# Patient Record
Sex: Female | Born: 1985 | Race: White | Hispanic: No | Marital: Married | Smoking: Never smoker
Health system: Southern US, Community
[De-identification: ages and names within clinical notes are randomized; demographics above are authoritative.]

## PROBLEM LIST (undated history)

## (undated) DIAGNOSIS — C8190 Hodgkin lymphoma, unspecified, unspecified site: Secondary | ICD-10-CM

## (undated) DIAGNOSIS — G43009 Migraine without aura, not intractable, without status migrainosus: Secondary | ICD-10-CM

## (undated) DIAGNOSIS — B009 Herpesviral infection, unspecified: Secondary | ICD-10-CM

## (undated) DIAGNOSIS — J45909 Unspecified asthma, uncomplicated: Secondary | ICD-10-CM

## (undated) DIAGNOSIS — F418 Other specified anxiety disorders: Secondary | ICD-10-CM

## (undated) HISTORY — DX: Hodgkin lymphoma, unspecified, unspecified site: C81.90

## (undated) HISTORY — DX: Migraine without aura, not intractable, without status migrainosus: G43.009

## (undated) HISTORY — DX: Herpesviral infection, unspecified: B00.9

## (undated) HISTORY — DX: Unspecified asthma, uncomplicated: J45.909

## (undated) HISTORY — DX: Other specified anxiety disorders: F41.8

---
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
## 2007-04-09 IMAGING — CT CT ABDOMEN W/ CM
2 of 5 series · 17 of 46 positions shown, 19 images · IV contrast (READICAT/WATER & [ID] OMNI 300)
Comparison: none

CLINICAL DATA: 20-year-old, with Hodgkin?s disease.  
ABDOMEN CT WITH CONTRAST:
TECHNIQUE: Multidetector CT imaging of the abdomen was performed following the standard protocol during bolus administration of intravenous contrast.
Contrast:  100 cc Omnipaque 300
TECHNIQUE: Multidetector CT imaging of the pelvis was performed following the standard protocol during bolus administration of intravenous contrast.

[Series 2: abdomen w/ · axial · 0.70mm/px · z∈[-377,-7]mm · 14 of 86 slices shown, 16 images]
[im 6/86  soft-tissue]
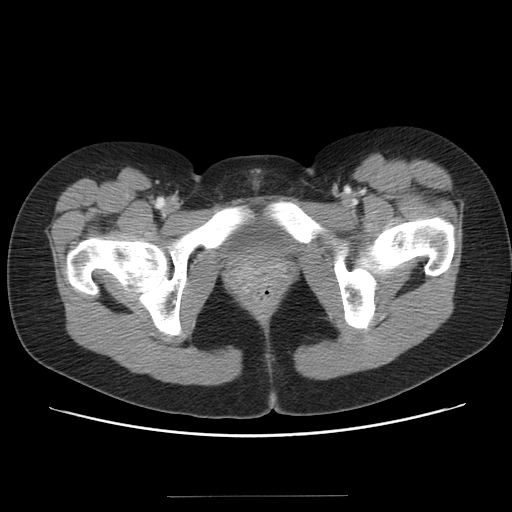
[im 6/86  bone]
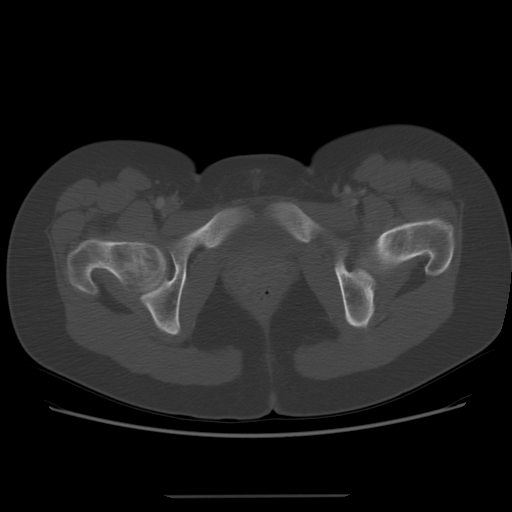
[im 12/86  soft-tissue]
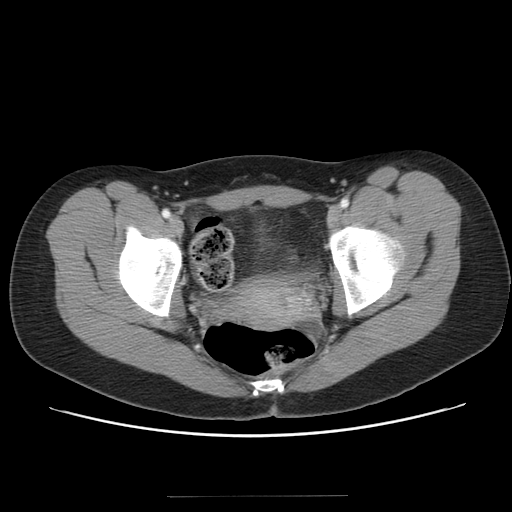
[im 18/86  soft-tissue]
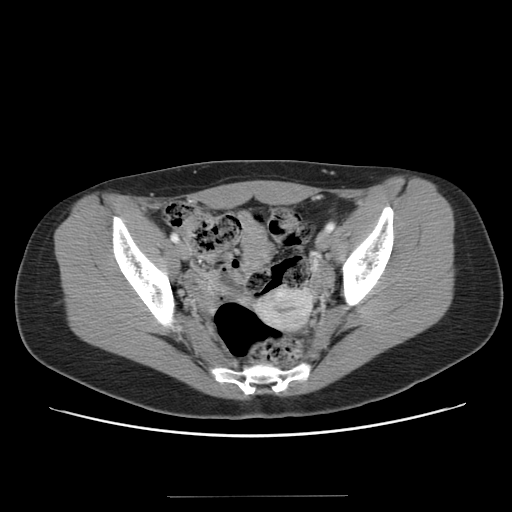
[im 23/86  soft-tissue]
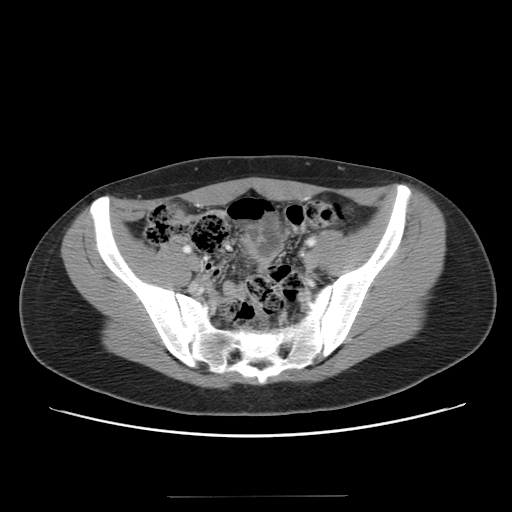
[im 29/86  soft-tissue]
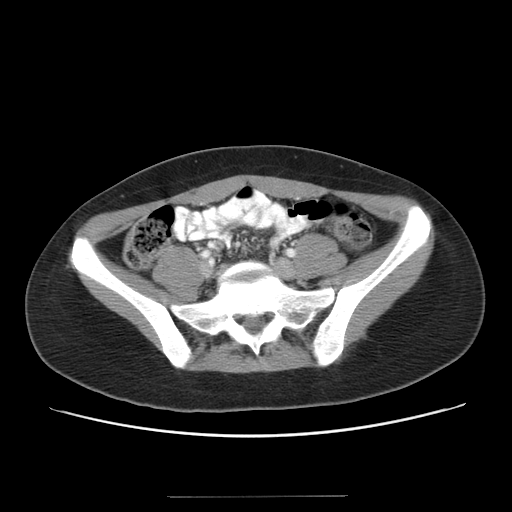
[im 35/86  soft-tissue]
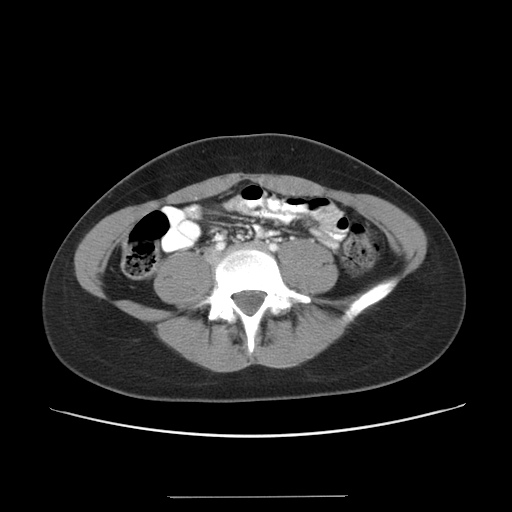
[im 40/86  soft-tissue]
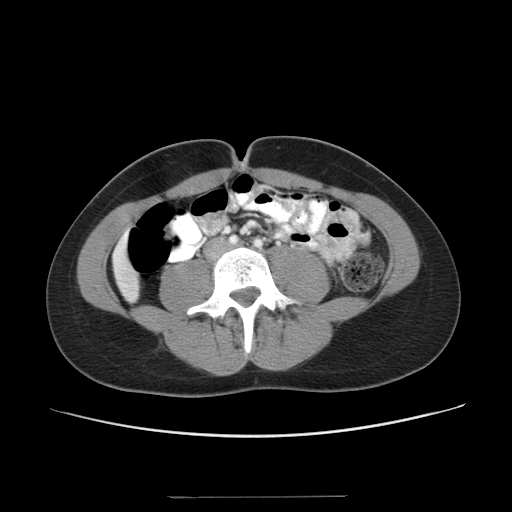
[im 46/86  soft-tissue]
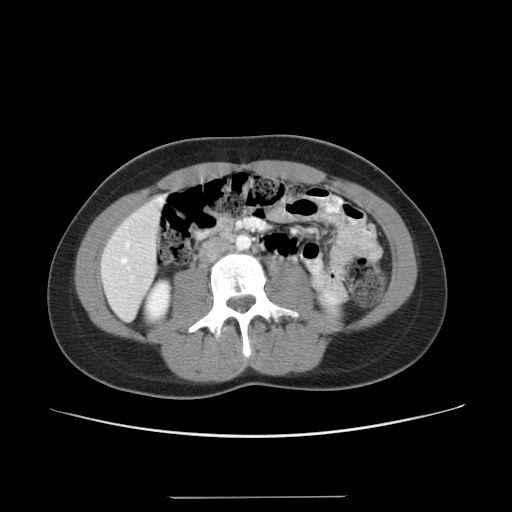
[im 52/86  soft-tissue]
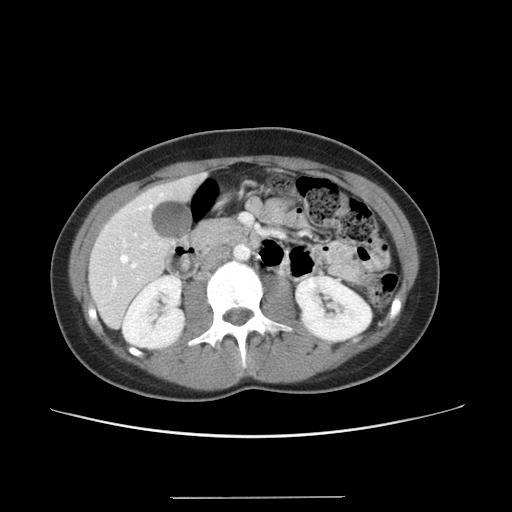
[im 52/86  bone]
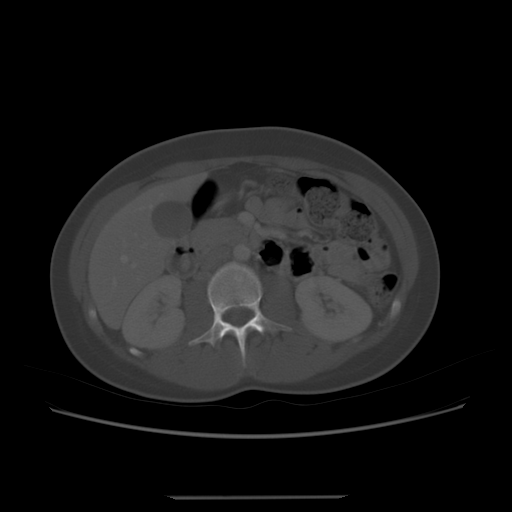
[im 57/86  soft-tissue]
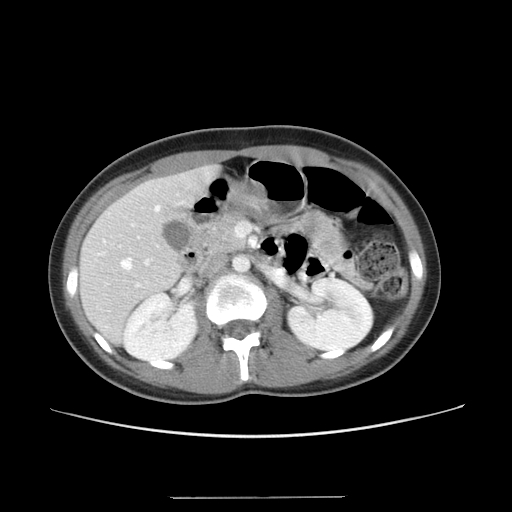
[im 63/86  soft-tissue]
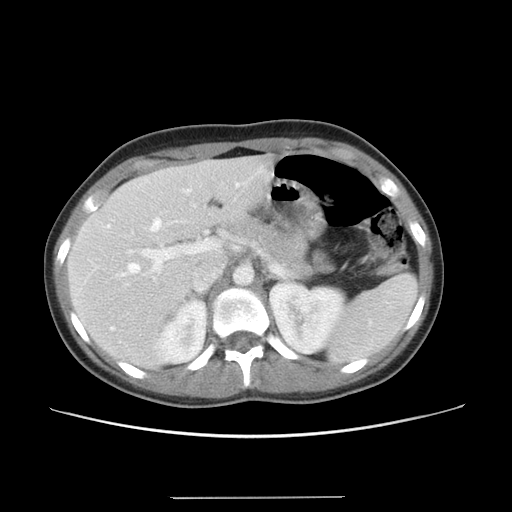
[im 69/86  soft-tissue]
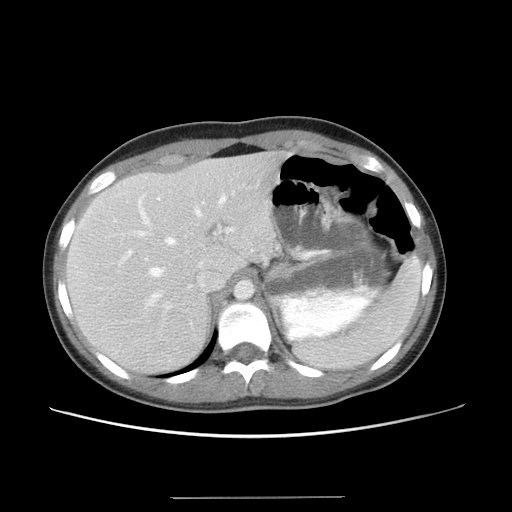
[im 74/86  soft-tissue]
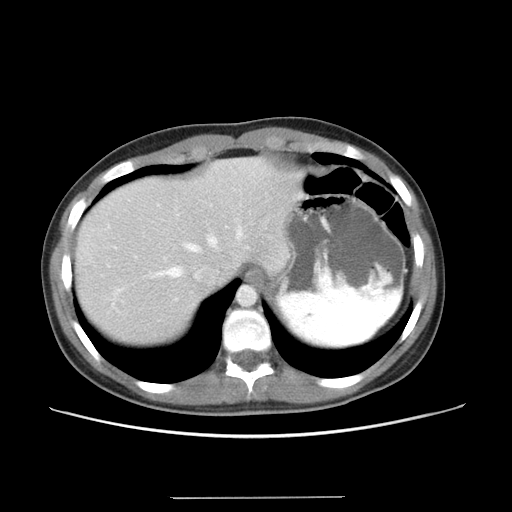
[im 80/86  soft-tissue]
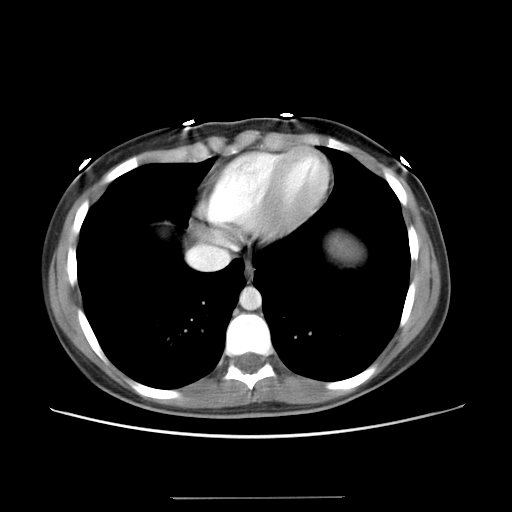

[Series 401: coronals · coronal · 0.90mm/px · 3 of 60 slices shown]
[im 20/60  soft-tissue]
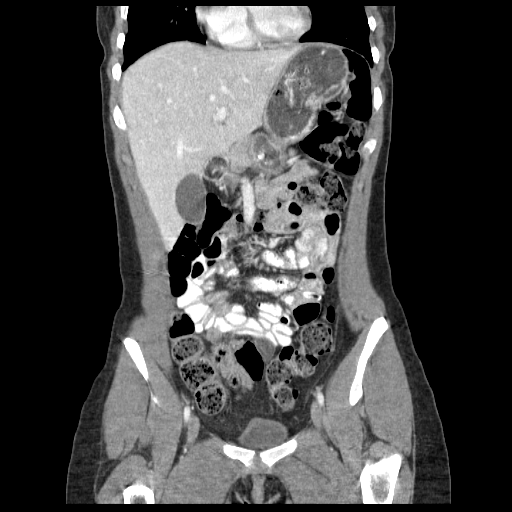
[im 27/60  soft-tissue]
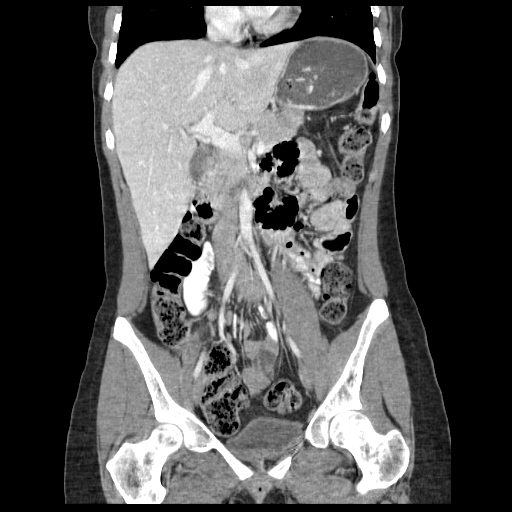
[im 33/60  soft-tissue]
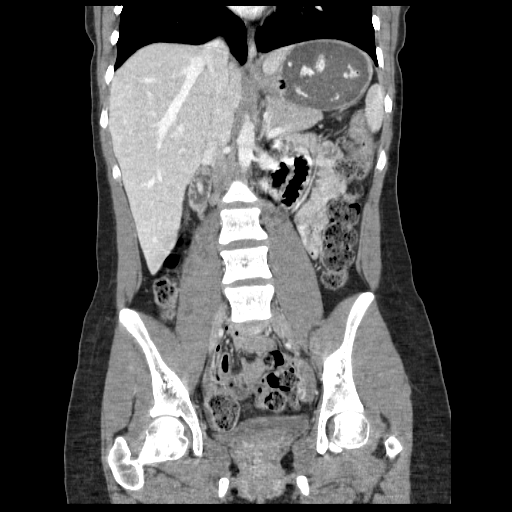

[17 of 46 positions shown; findings below may reference images not displayed]

FINDINGS: The lung bases are clear.  The liver, hepatic veins, portal veins, and gallbladder have a normal appearance.  The adrenal glands, kidneys, spleen, stomach, and pancreas are within normal limits.  There is no significant abdominal lymphadenopathy.  There is bowel in the left upper quadrant anterior to the stomach and spleen.  No acute bone abnormalities.
IMPRESSION: Negative CT of the abdomen. 
PELVIS CT WITH CONTRAST:
FINDINGS: The uterus and adnexal structures appear to be normal for age.  Small tubular structure in the right lower quadrant probably represents the appendix but it is hard to differentiate appendix from small bowel loops in this area.  No definite inflammatory changes.  Negative for lymphadenopathy in the pelvis.  Small amount of free fluid in the left cul-de-sac probably physiologic in a patient of this age.  No acute bone abnormalities.
IMPRESSION: 1.  No acute findings in the pelvis.  No significant lymphadenopathy.  
2.  Small amount of free fluid likely physiologic in nature.

## 2007-09-24 ENCOUNTER — Inpatient Hospital Stay (HOSPITAL_COMMUNITY): Admission: AD | Admit: 2007-09-24 | Discharge: 2007-09-24 | Payer: Self-pay | Admitting: Obstetrics & Gynecology

## 2008-05-16 ENCOUNTER — Other Ambulatory Visit: Admission: RE | Admit: 2008-05-16 | Discharge: 2008-05-16 | Payer: Self-pay | Admitting: Obstetrics and Gynecology

## 2008-05-24 ENCOUNTER — Emergency Department (HOSPITAL_COMMUNITY): Admission: EM | Admit: 2008-05-24 | Discharge: 2008-05-24 | Payer: Self-pay | Admitting: Emergency Medicine

## 2010-04-07 HISTORY — PX: BREAST SURGERY: SHX581

## 2010-07-23 LAB — RAPID URINE DRUG SCREEN, HOSP PERFORMED
Amphetamines: NOT DETECTED
Benzodiazepines: NOT DETECTED
Cocaine: NOT DETECTED
Tetrahydrocannabinol: NOT DETECTED

## 2010-07-23 LAB — PREGNANCY, URINE: Preg Test, Ur: NEGATIVE

## 2010-07-23 LAB — ETHANOL: Alcohol, Ethyl (B): 321 mg/dL — ABNORMAL HIGH (ref 0–10)

## 2010-08-20 NOTE — Op Note (Signed)
Susan Hartman, Susan Hartman           ACCOUNT NO.:  [id number]   MEDICAL RECORD NO.:  [id number]          PATIENT TYPE:  AMB   LOCATION:  [facility]                          FACILITY:  [facility]   PHYSICIAN:  Cherylynn Ridges, M.D.    DATE OF BIRTH:  Jul 30, 1985   DATE OF PROCEDURE:  DATE OF DISCHARGE:                               OPERATIVE REPORT   PREOPERATIVE DIAGNOSIS:  Functional Port-A-Cath and left subclavian  system.   POSTOPERATIVE DIAGNOSIS:  Functional Port-A-Cath and left subclavian  system.   PROCEDURE:  Removal of Port-A-Cath, this was done in the minor room with  [facility].   Dr. Lindie Spruce was the surgeon.   ANESTHESIA:  Local 10 mL of 1% Xylocaine without epinephrine.   ESTIMATED BLOOD LOSS:  Less than 10 mL.   COMPLICATIONS:  None.   CONDITION:  Stable.   OPERATION:  The patient was done in the minor room.  She was prepped  with Betadine.  We anesthetized the patient's previous scar from the  Port-A-Cath placement with 1% Xylocaine using a 30 gauge needle.  We  made an oval incision around the old widened scar.  Cut it down to  subcutaneous tissue.  We dissected off the scar down to the cath itself  and then dissected off the catheter from the subclavian vein and then  also from the pocket itself.  We excised the scar tissue using a 15  blade.  Once all of the pieces of the Port-A-Cath were removed, we  closed in 2 layers, deep subcutaneous layer 4-0 Vicryl, and then the  skin was closed using running subcuticular stitch of 4-0 Monocryl.  A  sterile dressing was applied including 1/4 inch Steri-Strips and  antibiotic ointment.   The patient was allowed to go home immediately after the procedure.  A  sterile dressing was applied.      Cherylynn Ridges, M.D.  Electronically Signed     JOW/MEDQ  D:  11/26/2006  T:  11/26/2006  Job:  [id number]

## 2010-08-20 NOTE — Consult Note (Signed)
Susan Hartman, Susan Hartman           ACCOUNT NO.:  [id number]   MEDICAL RECORD NO.:  [id number]          PATIENT TYPE:  MAT   LOCATION:  [facility]                          FACILITY:  [facility]   PHYSICIAN:  M. Leda Quail, MD  DATE OF BIRTH:  25-Dec-1985   DATE OF CONSULTATION:  DATE OF DISCHARGE:  09/24/2007                                 CONSULTATION   CHIEF COMPLAINT:  Flank pain.   HISTORY OF PRESENT ILLNESS:  The patient is a 25 year old G1, A1 female  who was seen yesterday at the Urology Office by Jetta Lout secondary  to right flank pain.  She had what looked like a urinary tract infection  and she was treated with a single dose of IV antibiotics.  Her past  medical history is significant for a termination in May and what she  reports is a normal menstrual period in June.  A urine pregnancy test  was performed which was positive and an hCG was performed which was also  positive.  Quantitative was 11.  They had the patient come back today.  She does state that she is feeling better and her flank pain is better  and she has had no fevers.  However, it is still persistent and there is  some right lower quadrant pain as well.  They were anxious about a  possibility of an ectopic pregnancy and called me.  The patient reports  currently that her pain is better.  She has had some flank pain off and  on and lasts 24 hours over the last 2 days, but even that is better.  No  fevers at home.  No current vaginal bleeding.  No discharge.  No nausea,  vomiting, diarrhea, or constipation.  She is not very good with the date  of her termination.  We are going to guess about 6 weeks ago and she  does state that she had her last menstrual period in June, but she is  not clearly sure about that as well.  The patient's past medical history  is significant for lymphoma.  She was treated with chemotherapy during  the last year.  She has also had a history of abnormal Pap smears.  She  also has a history  of PACs of the heart.   MEDICATIONS:  Xanax p.r.n. and NuvaRing which she did state she started  after her termination.   ALLERGIES:  She is not allergic to any antibiotics.   SOCIAL HISTORY:  She does smoke.  She denies drugs or alcohol use.   PHYSICAL EXAMINATION:  VITAL SIGNS:  Temp is 97.7, BP 132/77, pulse 76,  and respirations 20.  GENERAL:  Thin, well-nourished, well-developed female in no acute  distress.  CARDIOVASCULAR:  Regular rate and rhythm without murmurs, rubs, or  gallops.  LUNGS:  Clear to auscultation bilaterally.  ABDOMEN:  Soft, nontender, and nondistended.  Normal bowel sounds are  present.  No hepatosplenomegaly, hernias, masses, rebound, or guarding  is noted.  Some mild right flank tenderness.  Brief bimanual exam was  performed, which showed no cervical motion tenderness.  No adnexal  masses.  LABORATORY DATA:  White count 6.1, hemoglobin 14.3, and platelets 263.  Blood type O positive.  HGG here is 9 compared to 11 yesterday.  A GYN  ultrasound was performed which showed normal ovaries.  The right ovary  measures 4.2 x 1.2 x 1.4 cm and the left is 2.9 x 2.9 x 1.9 cm.  Thin  endometrial stripe with no evidence of gestational sac or IUP.  Normal  ovaries.  No adnexal masses, no pelvic mass is seen.   ASSESSMENT AND PLAN:  Urinary tract infection with possible  pyelonephritis. Ciprofloxacin 500 mg b.i.d. x10 days was given to the  patient.  She will follow up in my office next Friday at 11:30 for a hCG  quant.  Gonorrhea and  chlamydia are currently pending, and these were done at the Urology  Office.  She is also going to follow up with them in 2 weeks.  She was  advised to call if she has a fever over the weekend, particularly  greater than 100.5 or if her pain worsens.  Instructions were provided  both to the patient and her mother.      Lum Keas, MD  Electronically Signed     MSM/MEDQ  D:  09/24/2007  T:  09/25/2007  Job:  [id number]

## 2010-08-23 NOTE — Op Note (Signed)
NAMEMARITTA, Susan           ACCOUNT NO.:  [id number]   MEDICAL RECORD NO.:  [id number]          PATIENT TYPE:  AMB   LOCATION:  [facility]                          FACILITY:  [facility]   PHYSICIAN:  Cherylynn Ridges, M.D.    DATE OF BIRTH:  1986-03-28   DATE OF PROCEDURE:  02/18/2006  DATE OF DISCHARGE:                                 OPERATIVE REPORT   PREOPERATIVE DIAGNOSIS:  Right supraclavicular and axillary adenopathy.   POSTOPERATIVE DIAGNOSIS:  Right supraclavicular and axillary adenopathy.   PROCEDURE:  A right supraclavicular lymph node biopsy.   SURGEON:  Marta Lamas. Lindie Spruce, M.D.  No assistant.   ANESTHESIA:  General with a laryngeal airway.   ESTIMATED BLOOD LOSS:  Less than 10 mL.   COMPLICATIONS:  None.   CONDITION:  Stable.   SPECIMEN:  A 2.5 x 2 cm rounded, firm supraclavicular lymph node, which was  sent fresh for lymphoma workup.   INDICATIONS FOR OPERATION:  The patient is an 25 year old with spawning  supraclavicular adenopathy, who comes in now for biopsy.   FINDINGS:  The patient had multiple supraclavicular lymph nodes on the right  side but we removed just the largest one, which could be seen protruding  underneath the skin.   OPERATION:  The patient was taken to the operating room and placed on the  table in supine position.  After an adequate general laryngeal airway  anesthetic was administered, she was prepped and draped in the usual sterile  manner exposing the right supraclavicular area.   We marked the area of skin incision directly overlying the mass on the right  side.  We subsequently used a 15 blade to make an 1-1/2 to 2-inch incision  directly above that, dissecting down to the platysma muscle and then through  the platysma muscle down to the lymph node, which was carefully dissected  away from its surrounding structures using hemoclips to obtain adequate  hemostasis of the blood vessels that were feeding this large mass.  There  was a more  medial and inferior lymph node that was attached to the one that  was removed, which we did not attempt to excise as we felt as though we had  adequate specimen with a 2.5 x 2 cm lymph node that was sent fresh for  lymphoma workup.   We irrigated with saline and closed in two layers, the platysma layer of  interrupted of 4-0 Vicryl, then the skin was closed using a running  subcuticular stitch of 5-0 Monocryl.  Marcaine 0.25% with epinephrine was  injected underneath the skin prior to closure and then a sterile dressing  was applied, including Steri-Strips.      Cherylynn Ridges, M.D.  Electronically Signed     JOW/MEDQ  D:  02/18/2006  T:  02/18/2006  Job:  [id number]   cc:   Carma Leaven, DO

## 2011-01-02 LAB — HCG, QUANTITATIVE, PREGNANCY: hCG, Beta Chain, Quant, S: 9 — ABNORMAL HIGH

## 2011-01-02 LAB — CBC
MCHC: 33.8
MCV: 95.9
Platelets: 263
RDW: 11.8

## 2011-01-17 LAB — DIFFERENTIAL
Basophils Absolute: 0
Eosinophils Relative: 2
Lymphocytes Relative: 39
Lymphs Abs: 2
Neutro Abs: 2.7
Neutrophils Relative %: 54

## 2011-01-17 LAB — CBC
HCT: 39.6
Platelets: 237
RDW: 15.2 — ABNORMAL HIGH
WBC: 5.1

## 2011-01-17 LAB — PROTIME-INR
INR: 1
Prothrombin Time: 12.9

## 2012-08-03 ENCOUNTER — Telehealth: Payer: Self-pay | Admitting: Nurse Practitioner

## 2012-08-03 ENCOUNTER — Other Ambulatory Visit: Payer: Self-pay | Admitting: Nurse Practitioner

## 2012-08-03 DIAGNOSIS — R200 Anesthesia of skin: Secondary | ICD-10-CM

## 2012-08-03 MED ORDER — LIDOCAINE-PRILOCAINE 2.5-2.5 % EX CREA: TOPICAL_CREAM | CUTANEOUS | Status: DC | PRN

## 2012-08-03 MED ORDER — LIDOCAINE-PRILOCAINE 2.5-2.5 % EX CREA: 1.0000 "application " | TOPICAL_CREAM | CUTANEOUS | Status: DC | PRN

## 2012-08-03 NOTE — Telephone Encounter (Signed)
Patient reqeust a Rx to pharmacy in [place], [place] for Lidocaine Cream. States she is having tomorrow a tattoo removed and was told to have doctor prescribe this to avoid being so painful. Patient states she has no PCP. Tattoo is on her arm 3" long and 2" wide. Please advise. Will let patient know. sue

## 2012-08-03 NOTE — Telephone Encounter (Signed)
Pt was wondering if patty can give her a Rx for lidocaine creme.

## 2012-08-03 NOTE — Telephone Encounter (Signed)
Patient notified of medication sent to her pharmacy

## 2012-08-03 NOTE — Telephone Encounter (Signed)
Emla cream has been put in as new order to [facility].  Let her know

## 2013-03-10 ENCOUNTER — Encounter: Payer: Self-pay | Admitting: Nurse Practitioner

## 2013-03-10 ENCOUNTER — Ambulatory Visit: Payer: Self-pay | Admitting: Nurse Practitioner

## 2013-03-11 ENCOUNTER — Encounter: Payer: Self-pay | Admitting: Nurse Practitioner

## 2013-06-21 ENCOUNTER — Ambulatory Visit: Payer: Self-pay | Admitting: Nurse Practitioner

## 2013-06-21 ENCOUNTER — Encounter: Payer: Self-pay | Admitting: Nurse Practitioner

## 2013-06-21 ENCOUNTER — Ambulatory Visit (INDEPENDENT_AMBULATORY_CARE_PROVIDER_SITE_OTHER): Payer: BC Managed Care – PPO | Admitting: Nurse Practitioner

## 2013-06-21 VITALS — BP 112/70 | HR 72 | Ht 61.0 in | Wt 109.0 lb

## 2013-06-21 DIAGNOSIS — N390 Urinary tract infection, site not specified: Secondary | ICD-10-CM

## 2013-06-21 DIAGNOSIS — Z0000 Encounter for general adult medical examination without abnormal findings: Secondary | ICD-10-CM

## 2013-06-21 DIAGNOSIS — Z01419 Encounter for gynecological examination (general) (routine) without abnormal findings: Secondary | ICD-10-CM

## 2013-06-21 LAB — POCT URINALYSIS DIPSTICK
Bilirubin, UA: NEGATIVE
GLUCOSE UA: NEGATIVE
Ketones, UA: NEGATIVE
LEUKOCYTES UA: NEGATIVE
Nitrite, UA: POSITIVE
Protein, UA: NEGATIVE
UROBILINOGEN UA: NEGATIVE
pH, UA: 5

## 2013-06-21 LAB — HEMOGLOBIN, FINGERSTICK: Hemoglobin, fingerstick: 13.7 g/dL (ref 12.0–16.0)

## 2013-06-21 MED ORDER — VALACYCLOVIR HCL 1 G PO TABS: ORAL_TABLET | ORAL | Status: DC

## 2013-06-21 MED ORDER — CIPROFLOXACIN HCL 500 MG PO TABS: 500.0000 mg | ORAL_TABLET | Freq: Two times a day (BID) | ORAL | Status: DC

## 2013-06-21 MED ORDER — ETONOGESTREL-ETHINYL ESTRADIOL 0.12-0.015 MG/24HR VA RING: VAGINAL_RING | VAGINAL | Status: DC

## 2013-06-21 NOTE — Patient Instructions (Signed)
General topics  Next pap or exam is  due in 1 year Take a Women's multivitamin Take 1200 mg. of calcium daily - prefer dietary If any concerns in interim to call back  Breast Self-Awareness Practicing breast self-awareness may pick up problems early, prevent significant medical complications, and possibly save your life. By practicing breast self-awareness, you can become familiar with how your breasts look and feel and if your breasts are changing. This allows you to notice changes early. It can also offer you some reassurance that your breast health is good. One way to learn what is normal for your breasts and whether your breasts are changing is to do a breast self-exam. If you find a lump or something that was not present in the past, it is best to contact your caregiver right away. Other findings that should be evaluated by your caregiver include nipple discharge, especially if it is bloody; skin changes or reddening; areas where the skin seems to be pulled in (retracted); or new lumps and bumps. Breast pain is seldom associated with cancer (malignancy), but should also be evaluated by a caregiver. BREAST SELF-EXAM The best time to examine your breasts is 5 7 days after your menstrual period is over.  ExitCare Patient Information 2013 [employer].   Exercise to Stay Healthy Exercise helps you become and stay healthy. EXERCISE IDEAS AND TIPS Choose exercises that:  You enjoy.  Fit into your day. You do not need to exercise really hard to be healthy. You can do exercises at a slow or medium level and stay healthy. You can:  Stretch before and after working out.  Try yoga, Pilates, or tai chi.  Lift weights.  Walk fast, swim, jog, run, climb stairs, bicycle, dance, or rollerskate.  Take aerobic classes. Exercises that burn about 150 calories:  Running 1  miles in 15 minutes.  Playing volleyball for 45 to 60 minutes.  Washing and waxing a car for 45 to 60  minutes.  Playing touch football for 45 minutes.  Walking 1  miles in 35 minutes.  Pushing a stroller 1  miles in 30 minutes.  Playing basketball for 30 minutes.  Raking leaves for 30 minutes.  Bicycling 5 miles in 30 minutes.  Walking 2 miles in 30 minutes.  Dancing for 30 minutes.  Shoveling snow for 15 minutes.  Swimming laps for 20 minutes.  Walking up stairs for 15 minutes.  Bicycling 4 miles in 15 minutes.  Gardening for 30 to 45 minutes.  Jumping rope for 15 minutes.  Washing windows or floors for 45 to 60 minutes. Document Released: 04/26/2010 Document Revised: 06/16/2011 Document Reviewed: 04/26/2010 ExitCare Patient Information 2013 ExitCare[employer].   Other topics ( that may be useful information):    Sexually Transmitted Disease Sexually transmitted disease (STD) refers to any infection that is passed from person to person during sexual activity. This may happen by way of saliva, semen, blood, vaginal mucus, or urine. Common STDs include:  Gonorrhea.  Chlamydia.  Syphilis.  HIV/AIDS.  Genital herpes.  Hepatitis B and C.  Trichomonas.  Human papillomavirus (HPV).  Pubic lice. CAUSES  An STD may be spread by bacteria, virus, or parasite. A person can get an STD by:  Sexual intercourse with an infected person.  Sharing sex toys with an infected person.  Sharing needles with an infected person.  Having intimate contact with the genitals, mouth, or rectal areas of an infected person. SYMPTOMS  Some people may not have any symptoms, but   they can still pass the infection to others. Different STDs have different symptoms. Symptoms include:  Painful or bloody urination.  Pain in the pelvis, abdomen, vagina, anus, throat, or eyes.  Skin rash, itching, irritation, growths, or sores (lesions). These usually occur in the genital or anal area.  Abnormal vaginal discharge.  Penile discharge in men.  Soft, flesh-colored skin growths in the  genital or anal area.  Fever.  Pain or bleeding during sexual intercourse.  Swollen glands in the groin area.  Yellow skin and eyes (jaundice). This is seen with hepatitis. DIAGNOSIS  To make a diagnosis, your caregiver may:  Take a medical history.  Perform a physical exam.  Take a specimen (culture) to be examined.  Examine a sample of discharge under a microscope.  Perform blood test TREATMENT   Chlamydia, gonorrhea, trichomonas, and syphilis can be cured with antibiotic medicine.  Genital herpes, hepatitis, and HIV can be treated, but not cured, with prescribed medicines. The medicines will lessen the symptoms.  Genital warts from HPV can be treated with medicine or by freezing, burning (electrocautery), or surgery. Warts may come back.  HPV is a virus and cannot be cured with medicine or surgery.However, abnormal areas may be followed very closely by your caregiver and may be removed from the cervix, vagina, or vulva through office procedures or surgery. If your diagnosis is confirmed, your recent sexual partners need treatment. This is true even if they are symptom-free or have a negative culture or evaluation. They should not have sex until their caregiver says it is okay. HOME CARE INSTRUCTIONS  All sexual partners should be informed, tested, and treated for all STDs.  Take your antibiotics as directed. Finish them even if you start to feel better.  Only take over-the-counter or prescription medicines for pain, discomfort, or fever as directed by your caregiver.  Rest.  Eat a balanced diet and drink enough fluids to keep your urine clear or pale yellow.  Do not have sex until treatment is completed and you have followed up with your caregiver. STDs should be checked after treatment.  Keep all follow-up appointments, Pap tests, and blood tests as directed by your caregiver.  Only use latex condoms and water-soluble lubricants during sexual activity. Do not use  petroleum jelly or oils.  Avoid alcohol and illegal drugs.  Get vaccinated for HPV and hepatitis. If you have not received these vaccines in the past, talk to your caregiver about whether one or both might be right for you.  Avoid risky sex practices that can break the skin. The only way to avoid getting an STD is to avoid all sexual activity.Latex condoms and dental dams (for oral sex) will help lessen the risk of getting an STD, but will not completely eliminate the risk. SEEK MEDICAL CARE IF:   You have a fever.  You have any new or worsening symptoms. Document Released: 06/14/2002 Document Revised: 06/16/2011 Document Reviewed: 06/21/2010 [facility] Patient Information 2013 [place].    Domestic Abuse You are being battered or abused if someone close to you hits, pushes, or physically hurts you in any way. You also are being abused if you are forced into activities. You are being sexually abused if you are forced to have sexual contact of any kind. You are being emotionally abused if you are made to feel worthless or if you are constantly threatened. It is important to remember that help is available. No one has the right to abuse you. PREVENTION OF FURTHER  ABUSE  Learn the warning signs of danger. This varies with situations but may include: the use of alcohol, threats, isolation from friends and family, or forced sexual contact. Leave if you feel that violence is going to occur.  If you are attacked or beaten, report it to the police so the abuse is documented. You do not have to press charges. The police can protect you while you or the attackers are leaving. Get the officer's name and badge number and a copy of the report.  Find someone you can trust and tell them what is happening to you: your caregiver, a nurse, clergy member, close friend or family member. Feeling ashamed is natural, but remember that you have done nothing wrong. No one deserves abuse. Document Released:  03/21/2000 Document Revised: 06/16/2011 Document Reviewed: 05/30/2010 ExitCare Patient Information 2013 [employer].    How Much is Too Much Alcohol? Drinking too much alcohol can cause injury, accidents, and health problems. These types of problems can include:   Car crashes.  Falls.  Family fighting (domestic violence).  Drowning.  Fights.  Injuries.  Burns.  Damage to certain organs.  Having a baby with birth defects. ONE DRINK CAN BE TOO MUCH WHEN YOU ARE:  Working.  Pregnant or breastfeeding.  Taking medicines. Ask your doctor.  Driving or planning to drive. If you or someone you know has a drinking problem, get help from a doctor.  Document Released: 01/18/2009 Document Revised: 06/16/2011 Document Reviewed: 01/18/2009 ExitCare Patient Information 2013 [employer]Care[employer].   Smoking Hazards Smoking cigarettes is extremely bad for your health. Tobacco smoke has over 200 known poisons in it. There are over 60 chemicals in tobacco smoke that cause cancer. Some of the chemicals found in cigarette smoke include:   Cyanide.  Benzene.  Formaldehyde.  Methanol (wood alcohol).  Acetylene (fuel used in welding torches).  Ammonia. Cigarette smoke also contains the poisonous gases nitrogen oxide and carbon monoxide.  Cigarette smokers have an increased risk of many serious medical problems and Smoking causes approximately:  90% of all lung cancer deaths in men.  80% of all lung cancer deaths in women.  90% of deaths from chronic obstructive lung disease. Compared with nonsmokers, smoking increases the risk of:  Coronary heart disease by 2 to 4 times.  Stroke by 2 to 4 times.  Men developing lung cancer by 23 times.  Women developing lung cancer by 13 times.  Dying from chronic obstructive lung diseases by 12 times.  . Smoking is the most preventable cause of death and disease in our society.  WHY IS SMOKING ADDICTIVE?  Nicotine is the chemical  agent in tobacco that is capable of causing addiction or dependence.  When you smoke and inhale, nicotine is absorbed rapidly into the bloodstream through your lungs. Nicotine absorbed through the lungs is capable of creating a powerful addiction. Both inhaled and non-inhaled nicotine may be addictive.  Addiction studies of cigarettes and spit tobacco show that addiction to nicotine occurs mainly during the teen years, when young people begin using tobacco products. WHAT ARE THE BENEFITS OF QUITTING?  There are many health benefits to quitting smoking.   Likelihood of developing cancer and heart disease decreases. Health improvements are seen almost immediately.  Blood pressure, pulse rate, and breathing patterns start returning to normal soon after quitting. QUITTING SMOKING   American Lung Association - 1-800-LUNGUSA  American Cancer Society - 1-800-ACS-2345 Document Released: 05/01/2004 Document Revised: 06/16/2011 Document Reviewed: 01/03/2009 ExitCare Patient Information 2013 ExitCare,   LLC.   Stress Management Stress is a state of physical or mental tension that often results from changes in your life or normal routine. Some common causes of stress are:  Death of a loved one.  Injuries or severe illnesses.  Getting fired or changing jobs.  Moving into a new home. Other causes may be:  Sexual problems.  Business or financial losses.  Taking on a large debt.  Regular conflict with someone at home or at work.  Constant tiredness from lack of sleep. It is not just bad things that are stressful. It may be stressful to:  Win the lottery.  Get married.  Buy a new car. The amount of stress that can be easily tolerated varies from person to person. Changes generally cause stress, regardless of the types of change. Too much stress can affect your health. It may lead to physical or emotional problems. Too little stress (boredom) may also become stressful. SUGGESTIONS TO  REDUCE STRESS:  Talk things over with your family and friends. It often is helpful to share your concerns and worries. If you feel your problem is serious, you may want to get help from a professional counselor.  Consider your problems one at a time instead of lumping them all together. Trying to take care of everything at once may seem impossible. List all the things you need to do and then start with the most important one. Set a goal to accomplish 2 or 3 things each day. If you expect to do too many in a single day you will naturally fail, causing you to feel even more stressed.  Do not use alcohol or drugs to relieve stress. Although you may feel better for a short time, they do not remove the problems that caused the stress. They can also be habit forming.  Exercise regularly - at least 3 times per week. Physical exercise can help to relieve that "uptight" feeling and will relax you.  The shortest distance between despair and hope is often a good night's sleep.  Go to bed and get up on time allowing yourself time for appointments without being rushed.  Take a short "time-out" period from any stressful situation that occurs during the day. Close your eyes and take some deep breaths. Starting with the muscles in your face, tense them, hold it for a few seconds, then relax. Repeat this with the muscles in your neck, shoulders, hand, stomach, back and legs.  Take good care of yourself. Eat a balanced diet and get plenty of rest.  Schedule time for having fun. Take a break from your daily routine to relax. HOME CARE INSTRUCTIONS   Call if you feel overwhelmed by your problems and feel you can no longer manage them on your own.  Return immediately if you feel like hurting yourself or someone else. Document Released: 09/17/2000 Document Revised: 06/16/2011 Document Reviewed: 05/10/2007 ExitCare Patient Information 2013 ExitCare, LLC.  

## 2013-06-21 NOTE — Progress Notes (Signed)
Patient ID: Susan Hartman, female   DOB: 09-11-1985, 28 y.o.   MRN: [id number] 27 y.o. G1P0010 Single Caucasian Fe here for annual exam. Menses normally on Nuva Ring last for 4-5 days.  Moderate to light. Same partner for 3 years.  No concerns about STD's. She has had UTI symptoms for 2-3 days of urgency and slight dysuria.  Patient's last menstrual period was 06/08/2013.          Sexually active: yes  The current method of family planning is Nuva Ring vaginal inserts.    Exercising: yes  Gym/ health club routine includes cardio, light weights and one day of mod to heavy weights.  Pt goes to gym 4 times per week.. Smoker:  no  Health Maintenance: Pap: 03/02/12, WNL TDaP:  UTD Labs:  HB:  13.7  Urine:  Trace RBC, nitrate +   reports that she has never smoked. She has never used smokeless tobacco. She reports that she drinks about 0.5 ounces of alcohol per week. She reports that she does not use illicit drugs.  Past Medical History  Diagnosis Date  . Asthma   . Hodgkin lymphoma 11/07    Dr. Rockwell [place]  . HSV-2 infection     C&S    Past Surgical History  Procedure Laterality Date  . Breast surgery  04/2010    breast implants    Current Outpatient Prescriptions  Medication Sig Dispense Refill  . ALPRAZolam (XANAX PO) Take by mouth as needed.      . etonogestrel-ethinyl estradiol (NUVARING) 0.12-0.015 MG/24HR vaginal ring Insert vaginally and leave in place for 3 consecutive weeks, then remove for 1 week.  3 each  3  . ciprofloxacin (CIPRO) 500 MG tablet Take 1 tablet (500 mg total) by mouth 2 (two) times daily.  14 tablet  0  . valACYclovir (VALTREX) 1000 MG tablet 1/2 tablet daily and twice a day for flare X 5 days  90 tablet  3   No current facility-administered medications for this visit.    Family History  Problem Relation Age of Onset  . Hypertension Father   . Anxiety disorder Father     ROS:  Pertinent items are noted in HPI.  Otherwise, a comprehensive ROS was  negative.  Exam:   BP 112/70  Pulse 72  Ht 5\' 1"  (1.549 m)  Wt 109 lb (49.442 kg)  BMI 20.61 kg/m2  LMP 06/08/2013 Height: 5\' 1"  (154.9 cm)  Ht Readings from Last 3 Encounters:  06/21/13 5\' 1"  (1.549 m)    General appearance: alert, cooperative and appears stated age Head: Normocephalic, without obvious abnormality, atraumatic Neck: no adenopathy, supple, symmetrical, trachea midline and thyroid normal to inspection and palpation Lungs: clear to auscultation bilaterally Breasts: normal appearance, no masses or tenderness, positive findings: implants bilaterally Heart: regular rate and rhythm Abdomen: soft, non-tender; no masses,  no organomegaly Extremities: extremities normal, atraumatic, no cyanosis or edema Skin: Skin color, texture, turgor normal. No rashes or lesions Lymph nodes: Cervical, supraclavicular, and axillary nodes normal. No abnormal inguinal nodes palpated Neurologic: Grossly normal   Pelvic: External genitalia:  no lesions              Urethra:  normal appearing urethra with no masses, tenderness or lesions              Bartholin's and Skene's: normal                 Vagina: normal appearing vagina with normal color and discharge,  no lesions              Cervix: anteverted              Pap taken: yes Bimanual Exam:  Uterus:  normal size, contour, position, consistency, mobility, non-tender              Adnexa: no mass, fullness, tenderness               Rectovaginal: Confirms               Anus:  normal sphincter tone, no lesions  A:  Well Woman with normal exam  Contraception with Nuva Ring  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

## 2013-06-21 NOTE — Progress Notes (Signed)
Encounter reviewed by Dr. Timia Casselman Silva.  

## 2013-06-23 LAB — IPS PAP TEST WITH REFLEX TO HPV

## 2013-06-24 LAB — URINE CULTURE

## 2014-02-06 ENCOUNTER — Encounter: Payer: Self-pay | Admitting: Nurse Practitioner

## 2014-05-18 ENCOUNTER — Telehealth: Payer: Self-pay | Admitting: Nurse Practitioner

## 2014-05-18 NOTE — Telephone Encounter (Signed)
Patient wants to talk with the nurse about having some blood work done.

## 2014-05-19 NOTE — Telephone Encounter (Signed)
Message left to return call to Owin Vignola at [phone].    

## 2014-06-05 NOTE — Telephone Encounter (Signed)
Message left to return call to Camdon Saetern at [phone].    

## 2014-06-26 ENCOUNTER — Encounter: Payer: Self-pay | Admitting: Nurse Practitioner

## 2014-06-26 ENCOUNTER — Ambulatory Visit (INDEPENDENT_AMBULATORY_CARE_PROVIDER_SITE_OTHER): Payer: BLUE CROSS/BLUE SHIELD | Admitting: Nurse Practitioner

## 2014-06-26 VITALS — BP 90/64 | HR 68 | Ht 61.0 in | Wt 114.0 lb

## 2014-06-26 DIAGNOSIS — N926 Irregular menstruation, unspecified: Secondary | ICD-10-CM | POA: Diagnosis not present

## 2014-06-26 DIAGNOSIS — Z0000 Encounter for general adult medical examination without abnormal findings: Secondary | ICD-10-CM | POA: Diagnosis not present

## 2014-06-26 DIAGNOSIS — Z01419 Encounter for gynecological examination (general) (routine) without abnormal findings: Secondary | ICD-10-CM | POA: Diagnosis not present

## 2014-06-26 DIAGNOSIS — Z23 Encounter for immunization: Secondary | ICD-10-CM | POA: Diagnosis not present

## 2014-06-26 DIAGNOSIS — Z113 Encounter for screening for infections with a predominantly sexual mode of transmission: Secondary | ICD-10-CM | POA: Diagnosis not present

## 2014-06-26 LAB — POCT URINALYSIS DIPSTICK
Bilirubin, UA: NEGATIVE
GLUCOSE UA: NEGATIVE
Ketones, UA: NEGATIVE
LEUKOCYTES UA: NEGATIVE
NITRITE UA: NEGATIVE
PH UA: 6
Protein, UA: NEGATIVE
RBC UA: NEGATIVE
UROBILINOGEN UA: NEGATIVE

## 2014-06-26 LAB — HEMOGLOBIN, FINGERSTICK: HEMOGLOBIN, FINGERSTICK: 13.4 g/dL (ref 12.0–16.0)

## 2014-06-26 LAB — POCT URINE PREGNANCY: Preg Test, Ur: NEGATIVE

## 2014-06-26 MED ORDER — ETONOGESTREL-ETHINYL ESTRADIOL 0.12-0.015 MG/24HR VA RING: VAGINAL_RING | VAGINAL | Status: DC

## 2014-06-26 MED ORDER — VALACYCLOVIR HCL 1 G PO TABS: ORAL_TABLET | ORAL | Status: DC

## 2014-06-26 NOTE — Telephone Encounter (Signed)
Patient had office visit on 06/22/14. Will close encounter. Routing to provider for final review. Patient agreeable to disposition. Will close encounter

## 2014-06-26 NOTE — Patient Instructions (Signed)
General topics  Next pap or exam is  due in 1 year Take a Women's multivitamin Take 1200 mg. of calcium daily - prefer dietary If any concerns in interim to call back  Breast Self-Awareness Practicing breast self-awareness may pick up problems early, prevent significant medical complications, and possibly save your life. By practicing breast self-awareness, you can become familiar with how your breasts look and feel and if your breasts are changing. This allows you to notice changes early. It can also offer you some reassurance that your breast health is good. One way to learn what is normal for your breasts and whether your breasts are changing is to do a breast self-exam. If you find a lump or something that was not present in the past, it is best to contact your caregiver right away. Other findings that should be evaluated by your caregiver include nipple discharge, especially if it is bloody; skin changes or reddening; areas where the skin seems to be pulled in (retracted); or new lumps and bumps. Breast pain is seldom associated with cancer (malignancy), but should also be evaluated by a caregiver. BREAST SELF-EXAM The best time to examine your breasts is 5 7 days after your menstrual period is over.  ExitCare Patient Information 2013 [employer].   Exercise to Stay Healthy Exercise helps you become and stay healthy. EXERCISE IDEAS AND TIPS Choose exercises that:  You enjoy.  Fit into your day. You do not need to exercise really hard to be healthy. You can do exercises at a slow or medium level and stay healthy. You can:  Stretch before and after working out.  Try yoga, Pilates, or tai chi.  Lift weights.  Walk fast, swim, jog, run, climb stairs, bicycle, dance, or rollerskate.  Take aerobic classes. Exercises that burn about 150 calories:  Running 1  miles in 15 minutes.  Playing volleyball for 45 to 60 minutes.  Washing and waxing a car for 45 to 60  minutes.  Playing touch football for 45 minutes.  Walking 1  miles in 35 minutes.  Pushing a stroller 1  miles in 30 minutes.  Playing basketball for 30 minutes.  Raking leaves for 30 minutes.  Bicycling 5 miles in 30 minutes.  Walking 2 miles in 30 minutes.  Dancing for 30 minutes.  Shoveling snow for 15 minutes.  Swimming laps for 20 minutes.  Walking up stairs for 15 minutes.  Bicycling 4 miles in 15 minutes.  Gardening for 30 to 45 minutes.  Jumping rope for 15 minutes.  Washing windows or floors for 45 to 60 minutes. Document Released: 04/26/2010 Document Revised: 06/16/2011 Document Reviewed: 04/26/2010 ExitCare Patient Information 2013 ExitCare[employer].   Other topics ( that may be useful information):    Sexually Transmitted Disease Sexually transmitted disease (STD) refers to any infection that is passed from person to person during sexual activity. This may happen by way of saliva, semen, blood, vaginal mucus, or urine. Common STDs include:  Gonorrhea.  Chlamydia.  Syphilis.  HIV/AIDS.  Genital herpes.  Hepatitis B and C.  Trichomonas.  Human papillomavirus (HPV).  Pubic lice. CAUSES  An STD may be spread by bacteria, virus, or parasite. A person can get an STD by:  Sexual intercourse with an infected person.  Sharing sex toys with an infected person.  Sharing needles with an infected person.  Having intimate contact with the genitals, mouth, or rectal areas of an infected person. SYMPTOMS  Some people may not have any symptoms, but   they can still pass the infection to others. Different STDs have different symptoms. Symptoms include:  Painful or bloody urination.  Pain in the pelvis, abdomen, vagina, anus, throat, or eyes.  Skin rash, itching, irritation, growths, or sores (lesions). These usually occur in the genital or anal area.  Abnormal vaginal discharge.  Penile discharge in men.  Soft, flesh-colored skin growths in the  genital or anal area.  Fever.  Pain or bleeding during sexual intercourse.  Swollen glands in the groin area.  Yellow skin and eyes (jaundice). This is seen with hepatitis. DIAGNOSIS  To make a diagnosis, your caregiver may:  Take a medical history.  Perform a physical exam.  Take a specimen (culture) to be examined.  Examine a sample of discharge under a microscope.  Perform blood test TREATMENT   Chlamydia, gonorrhea, trichomonas, and syphilis can be cured with antibiotic medicine.  Genital herpes, hepatitis, and HIV can be treated, but not cured, with prescribed medicines. The medicines will lessen the symptoms.  Genital warts from HPV can be treated with medicine or by freezing, burning (electrocautery), or surgery. Warts may come back.  HPV is a virus and cannot be cured with medicine or surgery.However, abnormal areas may be followed very closely by your caregiver and may be removed from the cervix, vagina, or vulva through office procedures or surgery. If your diagnosis is confirmed, your recent sexual partners need treatment. This is true even if they are symptom-free or have a negative culture or evaluation. They should not have sex until their caregiver says it is okay. HOME CARE INSTRUCTIONS  All sexual partners should be informed, tested, and treated for all STDs.  Take your antibiotics as directed. Finish them even if you start to feel better.  Only take over-the-counter or prescription medicines for pain, discomfort, or fever as directed by your caregiver.  Rest.  Eat a balanced diet and drink enough fluids to keep your urine clear or pale yellow.  Do not have sex until treatment is completed and you have followed up with your caregiver. STDs should be checked after treatment.  Keep all follow-up appointments, Pap tests, and blood tests as directed by your caregiver.  Only use latex condoms and water-soluble lubricants during sexual activity. Do not use  petroleum jelly or oils.  Avoid alcohol and illegal drugs.  Get vaccinated for HPV and hepatitis. If you have not received these vaccines in the past, talk to your caregiver about whether one or both might be right for you.  Avoid risky sex practices that can break the skin. The only way to avoid getting an STD is to avoid all sexual activity.Latex condoms and dental dams (for oral sex) will help lessen the risk of getting an STD, but will not completely eliminate the risk. SEEK MEDICAL CARE IF:   You have a fever.  You have any new or worsening symptoms. Document Released: 06/14/2002 Document Revised: 06/16/2011 Document Reviewed: 06/21/2010 [facility] Patient Information 2013 [place].    Domestic Abuse You are being battered or abused if someone close to you hits, pushes, or physically hurts you in any way. You also are being abused if you are forced into activities. You are being sexually abused if you are forced to have sexual contact of any kind. You are being emotionally abused if you are made to feel worthless or if you are constantly threatened. It is important to remember that help is available. No one has the right to abuse you. PREVENTION OF FURTHER  ABUSE  Learn the warning signs of danger. This varies with situations but may include: the use of alcohol, threats, isolation from friends and family, or forced sexual contact. Leave if you feel that violence is going to occur.  If you are attacked or beaten, report it to the police so the abuse is documented. You do not have to press charges. The police can protect you while you or the attackers are leaving. Get the officer's name and badge number and a copy of the report.  Find someone you can trust and tell them what is happening to you: your caregiver, a nurse, clergy member, close friend or family member. Feeling ashamed is natural, but remember that you have done nothing wrong. No one deserves abuse. Document Released:  03/21/2000 Document Revised: 06/16/2011 Document Reviewed: 05/30/2010 ExitCare Patient Information 2013 [employer].    How Much is Too Much Alcohol? Drinking too much alcohol can cause injury, accidents, and health problems. These types of problems can include:   Car crashes.  Falls.  Family fighting (domestic violence).  Drowning.  Fights.  Injuries.  Burns.  Damage to certain organs.  Having a baby with birth defects. ONE DRINK CAN BE TOO MUCH WHEN YOU ARE:  Working.  Pregnant or breastfeeding.  Taking medicines. Ask your doctor.  Driving or planning to drive. If you or someone you know has a drinking problem, get help from a doctor.  Document Released: 01/18/2009 Document Revised: 06/16/2011 Document Reviewed: 01/18/2009 ExitCare Patient Information 2013 [employer]Care[employer].   Smoking Hazards Smoking cigarettes is extremely bad for your health. Tobacco smoke has over 200 known poisons in it. There are over 60 chemicals in tobacco smoke that cause cancer. Some of the chemicals found in cigarette smoke include:   Cyanide.  Benzene.  Formaldehyde.  Methanol (wood alcohol).  Acetylene (fuel used in welding torches).  Ammonia. Cigarette smoke also contains the poisonous gases nitrogen oxide and carbon monoxide.  Cigarette smokers have an increased risk of many serious medical problems and Smoking causes approximately:  90% of all lung cancer deaths in men.  80% of all lung cancer deaths in women.  90% of deaths from chronic obstructive lung disease. Compared with nonsmokers, smoking increases the risk of:  Coronary heart disease by 2 to 4 times.  Stroke by 2 to 4 times.  Men developing lung cancer by 23 times.  Women developing lung cancer by 13 times.  Dying from chronic obstructive lung diseases by 12 times.  . Smoking is the most preventable cause of death and disease in our society.  WHY IS SMOKING ADDICTIVE?  Nicotine is the chemical  agent in tobacco that is capable of causing addiction or dependence.  When you smoke and inhale, nicotine is absorbed rapidly into the bloodstream through your lungs. Nicotine absorbed through the lungs is capable of creating a powerful addiction. Both inhaled and non-inhaled nicotine may be addictive.  Addiction studies of cigarettes and spit tobacco show that addiction to nicotine occurs mainly during the teen years, when young people begin using tobacco products. WHAT ARE THE BENEFITS OF QUITTING?  There are many health benefits to quitting smoking.   Likelihood of developing cancer and heart disease decreases. Health improvements are seen almost immediately.  Blood pressure, pulse rate, and breathing patterns start returning to normal soon after quitting. QUITTING SMOKING   American Lung Association - 1-800-LUNGUSA  American Cancer Society - 1-800-ACS-2345 Document Released: 05/01/2004 Document Revised: 06/16/2011 Document Reviewed: 01/03/2009 ExitCare Patient Information 2013 ExitCare,   LLC.   Stress Management Stress is a state of physical or mental tension that often results from changes in your life or normal routine. Some common causes of stress are:  Death of a loved one.  Injuries or severe illnesses.  Getting fired or changing jobs.  Moving into a new home. Other causes may be:  Sexual problems.  Business or financial losses.  Taking on a large debt.  Regular conflict with someone at home or at work.  Constant tiredness from lack of sleep. It is not just bad things that are stressful. It may be stressful to:  Win the lottery.  Get married.  Buy a new car. The amount of stress that can be easily tolerated varies from person to person. Changes generally cause stress, regardless of the types of change. Too much stress can affect your health. It may lead to physical or emotional problems. Too little stress (boredom) may also become stressful. SUGGESTIONS TO  REDUCE STRESS:  Talk things over with your family and friends. It often is helpful to share your concerns and worries. If you feel your problem is serious, you may want to get help from a professional counselor.  Consider your problems one at a time instead of lumping them all together. Trying to take care of everything at once may seem impossible. List all the things you need to do and then start with the most important one. Set a goal to accomplish 2 or 3 things each day. If you expect to do too many in a single day you will naturally fail, causing you to feel even more stressed.  Do not use alcohol or drugs to relieve stress. Although you may feel better for a short time, they do not remove the problems that caused the stress. They can also be habit forming.  Exercise regularly - at least 3 times per week. Physical exercise can help to relieve that "uptight" feeling and will relax you.  The shortest distance between despair and hope is often a good night's sleep.  Go to bed and get up on time allowing yourself time for appointments without being rushed.  Take a short "time-out" period from any stressful situation that occurs during the day. Close your eyes and take some deep breaths. Starting with the muscles in your face, tense them, hold it for a few seconds, then relax. Repeat this with the muscles in your neck, shoulders, hand, stomach, back and legs.  Take good care of yourself. Eat a balanced diet and get plenty of rest.  Schedule time for having fun. Take a break from your daily routine to relax. HOME CARE INSTRUCTIONS   Call if you feel overwhelmed by your problems and feel you can no longer manage them on your own.  Return immediately if you feel like hurting yourself or someone else. Document Released: 09/17/2000 Document Revised: 06/16/2011 Document Reviewed: 05/10/2007 ExitCare Patient Information 2013 ExitCare, LLC.  

## 2014-06-26 NOTE — Progress Notes (Signed)
Patient ID: Susan Hartman, female   DOB: Jul 15, 1985, 29 y.o.   MRN: [id number] 28 y.o. G1P0010 Single  Caucasian Fe here for annual exam.  Same partner for 4 years.  Menses for 4 days. Ran out of [employer] and now this cycle is still spotting - which is normal for her not on birth control.  She continues to live in [place] and works evenings as a Chief Operating Officer.  Patient's last menstrual period was 06/05/2014.          Sexually active: Yes.    The current method of family planning is Teacher, early years/pre vaginal inserts.    Exercising: Yes.    cardio and weight lifting Smoker:  no  Health Maintenance: Pap:  06/21/13, negative  TDaP:  UTD Labs: HB:  13.4  Urine:  Negative  UPT = negative   reports that she has never smoked. She has never used smokeless tobacco. She reports that she drinks about 0.5 oz of alcohol per week. She reports that she does not use illicit drugs.  Past Medical History  Diagnosis Date  . Asthma   . Hodgkin lymphoma 11/07    Dr. Rockwell [place]  . HSV-2 infection     C&S    Past Surgical History  Procedure Laterality Date  . Breast surgery  04/2010    breast implants    Current Outpatient Prescriptions  Medication Sig Dispense Refill  . ALPRAZolam (XANAX PO) Take by mouth as needed.    . etonogestrel-ethinyl estradiol (NUVARING) 0.12-0.015 MG/24HR vaginal ring Insert vaginally and leave in place for 3 consecutive weeks, then remove for 1 week. 3 each 3  . valACYclovir (VALTREX) 1000 MG tablet 1/2 tablet daily and twice a day for flare X 5 days 90 tablet 3   No current facility-administered medications for this visit.    Family History  Problem Relation Age of Onset  . Hypertension Father   . Anxiety disorder Father     ROS:  Pertinent items are noted in HPI.  Otherwise, a comprehensive ROS was negative.  Exam:   BP 90/64 mmHg  Pulse 68  Ht 5\' 1"  (1.549 m)  Wt 114 lb (51.71 kg)  BMI 21.55 kg/m2  LMP 06/05/2014 Height: 5\' 1"  (154.9 cm) Ht Readings from Last 3  Encounters:  06/26/14 5\' 1"  (1.549 m)  06/21/13 5\' 1"  (1.549 m)    General appearance: alert, cooperative and appears stated age Head: Normocephalic, without obvious abnormality, atraumatic Neck: no adenopathy, supple, symmetrical, trachea midline and thyroid normal to inspection and palpation Lungs: clear to auscultation bilaterally Breasts: normal appearance, no masses or tenderness, positive findings: implants bilaterally Heart: regular rate and rhythm Abdomen: soft, non-tender; no masses,  no organomegaly Extremities: extremities normal, atraumatic, no cyanosis or edema Skin: Skin color, texture, turgor normal. No rashes or lesions, multiple tattoos Lymph nodes: Cervical, supraclavicular, and axillary nodes normal. No abnormal inguinal nodes palpated Neurologic: Grossly normal   Pelvic: External genitalia:  no lesions              Urethra:  normal appearing urethra with no masses, tenderness or lesions              Bartholin's and Skene's: normal                 Vagina: normal appearing vagina with normal color and discharge, no lesions              Cervix: anteverted  Pap taken: No. 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

## 2014-06-27 LAB — STD PANEL
HIV 1&2 Ab, 4th Generation: NONREACTIVE
Hepatitis B Surface Ag: NEGATIVE

## 2014-06-28 LAB — IPS N GONORRHOEA AND CHLAMYDIA BY PCR

## 2014-06-29 NOTE — Progress Notes (Signed)
Encounter reviewed by Dr. Brook Silva.  

## 2014-09-07 ENCOUNTER — Telehealth: Payer: Self-pay | Admitting: Nurse Practitioner

## 2014-09-07 NOTE — Telephone Encounter (Signed)
Patient calling requesting to speak with the nurse about "not having a period while on Nuvaring."

## 2014-09-07 NOTE — Telephone Encounter (Signed)
Spoke with patient. Patient states that she forgot to take her Nuvaring out as scheduled for this month. Ring was in for over four weeks per patient. Took Nuvaring out over 1 week ago and has not yet started her cycle. Patient denies replacing or removing ring prior to this one late. Patient unable to state when her LMP was. Advised will need to take UPT. Patient states she took UPT today which was negative. Advised may wait until start of next cycle to insert new ring or if UPT was negative can replace ring at this time but UPT must be negative to reinsert. Patient is agreeable and verbalizes understanding.  Milford Cage, FNP anything further for this patient?

## 2014-09-08 NOTE — Telephone Encounter (Signed)
If she has no period by 3 months since last used needs OV, needs consistent condom use until period

## 2014-09-08 NOTE — Telephone Encounter (Signed)
Spoke with patient. Advised of message as seen below from [place]. Patient is agreeable and verbalizes understanding.  Routing to provider for final review. Patient agreeable to disposition. Will close encounter.

## 2014-09-08 NOTE — Telephone Encounter (Signed)
Left message to call Aadon Gorelik at [phone]. 

## 2014-09-25 ENCOUNTER — Telehealth: Payer: Self-pay | Admitting: Nurse Practitioner

## 2014-09-25 NOTE — Telephone Encounter (Signed)
Attempted to reach patient at number provided [phone]. Recording states "Im sorry the person you are trying to reach has a voicemail box that has not been set up. Please try again later. Goodbye."

## 2014-09-25 NOTE — Telephone Encounter (Signed)
Patient say "I  was told to call if she did start my period" Patient is asking for an appointment Wed. 09/27/14. Last seen 06/26/14.

## 2014-09-25 NOTE — Telephone Encounter (Signed)
Spoke with patient. Patient states that she has not had a cycle "I have not had one in so long I can not even remember when my last one was." Patient has taken 3 UPT which have all been negative. Requesting appointment for 6/22 due to work schedule. Appointment scheduled for 6/22 at 12:45pm. Patient is agreeable to date and time.   Routing to provider for final review. Patient agreeable to disposition. Will close encounter.

## 2014-09-27 ENCOUNTER — Encounter: Payer: Self-pay | Admitting: Nurse Practitioner

## 2014-09-27 ENCOUNTER — Ambulatory Visit (INDEPENDENT_AMBULATORY_CARE_PROVIDER_SITE_OTHER): Payer: BLUE CROSS/BLUE SHIELD | Admitting: Nurse Practitioner

## 2014-09-27 VITALS — BP 104/72 | HR 72 | Ht 61.0 in | Wt 112.0 lb

## 2014-09-27 DIAGNOSIS — N912 Amenorrhea, unspecified: Secondary | ICD-10-CM | POA: Diagnosis not present

## 2014-09-27 LAB — TSH: TSH: 0.903 u[IU]/mL (ref 0.350–4.500)

## 2014-09-27 LAB — POCT URINE PREGNANCY: PREG TEST UR: NEGATIVE

## 2014-09-27 MED ORDER — MEDROXYPROGESTERONE ACETATE 10 MG PO TABS: 10.0000 mg | ORAL_TABLET | Freq: Every day | ORAL | Status: DC

## 2014-09-27 NOTE — Progress Notes (Signed)
  29 y.o.Single white female presents with no menses since either February or March.  She was here for AEX and had a Nuva Ring left in an extra week since she had ran out.  She did have a menses she thinks after removal of ring in March even though it was very light.  Then on April 10 after removal of ring she had no menses.  She did not insert another ring since no menses.  In the interim no signs of menses except mild cramp in the lower back last month. She has not been using any method of birth control in the interim.  She has taken several UPT which  Has always been negative.  Some bloating and PMS but only occasionally.     No changes in diet, exercise, still working, same partner for 2.5 years. No head injury.  No chest pain, abdominal pain, usual joint pain in both hips without taking new medication. ( We have documentation of a menses on 06/05/2014 and now we think that might be the last one.)   Urine pregnancy negative.  Patient denies having been diagnosed with metabolic syndrome,denies polycystic ovaries.   A comprehensive review of systems was negative. No exam performed today.  Assessment:  Amenorrhea - post contraception use for many years Plan: Discussed with patient factors that may be contributory to menstrual abnormalities include diet anorexia and bulimia, hormonal factors with thyroid or prolactin and recent stressors pregnancy.  Previous treatments for menstrual abnormalities include none Labs: TSH and Prolactin, serum HCG  Will then give Provera challenge 10 mg for 10 days and to call back with -/+ response.

## 2014-09-27 NOTE — Patient Instructions (Signed)
We will call you with test results You will be taking Provera 10 mg for 10 days - then call back with response if any bleeding Do not take Provera until we call you with a pregnancy results

## 2014-09-28 LAB — PROLACTIN: Prolactin: 17 ng/mL

## 2014-09-28 LAB — HCG, SERUM, QUALITATIVE: Preg, Serum: NEGATIVE

## 2014-09-30 NOTE — Progress Notes (Signed)
Encounter reviewed by Dr. Brook Silva.  

## 2015-07-10 ENCOUNTER — Ambulatory Visit: Payer: BLUE CROSS/BLUE SHIELD | Admitting: Nurse Practitioner

## 2015-08-03 ENCOUNTER — Ambulatory Visit: Payer: BLUE CROSS/BLUE SHIELD | Admitting: Nurse Practitioner

## 2015-08-07 ENCOUNTER — Encounter: Payer: Self-pay | Admitting: Nurse Practitioner

## 2015-08-07 ENCOUNTER — Ambulatory Visit (INDEPENDENT_AMBULATORY_CARE_PROVIDER_SITE_OTHER): Payer: BLUE CROSS/BLUE SHIELD | Admitting: Nurse Practitioner

## 2015-08-07 VITALS — BP 100/66 | HR 64 | Ht 61.25 in | Wt 110.0 lb

## 2015-08-07 DIAGNOSIS — Z0000 Encounter for general adult medical examination without abnormal findings: Secondary | ICD-10-CM

## 2015-08-07 DIAGNOSIS — Z01419 Encounter for gynecological examination (general) (routine) without abnormal findings: Secondary | ICD-10-CM | POA: Diagnosis not present

## 2015-08-07 LAB — CBC WITH DIFFERENTIAL/PLATELET
BASOS PCT: 0 %
Basophils Absolute: 0 cells/uL (ref 0–200)
EOS PCT: 1 %
Eosinophils Absolute: 62 cells/uL (ref 15–500)
HEMATOCRIT: 40.5 % (ref 35.0–45.0)
HEMOGLOBIN: 13.5 g/dL (ref 11.7–15.5)
LYMPHS ABS: 1922 {cells}/uL (ref 850–3900)
Lymphocytes Relative: 31 %
MCH: 31.2 pg (ref 27.0–33.0)
MCHC: 33.3 g/dL (ref 32.0–36.0)
MCV: 93.5 fL (ref 80.0–100.0)
MONO ABS: 248 {cells}/uL (ref 200–950)
MPV: 9.4 fL (ref 7.5–12.5)
Monocytes Relative: 4 %
NEUTROS ABS: 3968 {cells}/uL (ref 1500–7800)
Neutrophils Relative %: 64 %
Platelets: 260 10*3/uL (ref 140–400)
RBC: 4.33 MIL/uL (ref 3.80–5.10)
RDW: 12.8 % (ref 11.0–15.0)
WBC: 6.2 10*3/uL (ref 3.8–10.8)

## 2015-08-07 MED ORDER — ETONOGESTREL-ETHINYL ESTRADIOL 0.12-0.015 MG/24HR VA RING: VAGINAL_RING | VAGINAL | Status: DC

## 2015-08-07 MED ORDER — VALACYCLOVIR HCL 1 G PO TABS: ORAL_TABLET | ORAL | Status: DC

## 2015-08-07 NOTE — Progress Notes (Signed)
Patient ID: Susan Hartman, female   DOB: 29-Aug-1985, 30 y.o.   MRN: [id number]  30 y.o. G1P0010 Single  Caucasian Fe here for annual exam.  Menses now at 4 days, some cramps for 2 days relief with OTC NSAID's.  Same partner for 5 years.  Now back in therapy for anxiety. She is now living alone after leaving her boyfriend for 2 months and he does not trust her.  She needs to establish care with a psychiatrist in her area for medication therapy.  Patient's last menstrual period was 07/17/2015 (exact date).          Sexually active: Yes.    The current method of family planning is condoms never and Nuva Ring vaginal inserts. Same partner x 5 years. Exercising: Yes.  light weights and yoga 3-4 times per week at gym Smoker:  no  Health Maintenance: Pap: 06/21/13, Negative  TDaP: 06/26/14 HIV: 06/26/14 Labs: HB: 13.3 Urine: Negative    reports that she has never smoked. She has never used smokeless tobacco. She reports that she drinks about 0.5 oz of alcohol per week. She reports that she does not use illicit drugs.  Past Medical History  Diagnosis Date  . Asthma   . Hodgkin lymphoma ([place]) 11/07    Dr. Rockwell [place]  . HSV-2 infection     C&S    Past Surgical History  Procedure Laterality Date  . Breast surgery  04/2010    breast implants    Current Outpatient Prescriptions  Medication Sig Dispense Refill  . ALPRAZolam (XANAX PO) Take by mouth as needed.    . etonogestrel-ethinyl estradiol (NUVARING) 0.12-0.015 MG/24HR vaginal ring Insert vaginally and leave in place for 3 consecutive weeks, then remove for 1 week. 3 each 3  . valACYclovir (VALTREX) 1000 MG tablet 1/2 tablet daily and twice a day for flare X 5 days 90 tablet 3   No current facility-administered medications for this visit.    Family History  Problem Relation Age of Onset  . Hypertension Father   . Anxiety disorder Father     ROS:  Pertinent items are noted in HPI.  Otherwise, a comprehensive ROS was  negative.  Exam:   BP 100/66 mmHg  Pulse 64  Ht 5' 1.25" (1.556 m)  Wt 110 lb (49.896 kg)  BMI 20.61 kg/m2  LMP 07/17/2015 (Exact Date) Height: 5' 1.25" (155.6 cm) Ht Readings from Last 3 Encounters:  08/07/15 5' 1.25" (1.556 m)  09/27/14 5\' 1"  (1.549 m)  06/26/14 5\' 1"  (1.549 m)    General appearance: alert, cooperative and appears stated age Head: Normocephalic, without obvious abnormality, atraumatic Neck: no adenopathy, supple, symmetrical, trachea midline and thyroid normal to inspection and palpation Lungs: clear to auscultation bilaterally Breasts: normal appearance, no masses or tenderness, positive findings: implants bilaterally Heart: regular rate and rhythm Abdomen: soft, non-tender; no masses,  no organomegaly Extremities: extremities normal, atraumatic, no cyanosis or edema Skin: Skin color, texture, turgor normal. No rashes or lesions Lymph nodes: Cervical, supraclavicular, and axillary nodes normal. No abnormal inguinal nodes palpated Neurologic: Grossly normal   Pelvic: External genitalia:  no lesions              Urethra:  normal appearing urethra with no masses, tenderness or lesions              Bartholin's and Skene's: normal                 Vagina: normal appearing vagina with normal color  and discharge, no lesions              Cervix: anteverted              Pap taken: Yes.   Bimanual Exam:  Uterus:  normal size, contour, position, consistency, mobility, non-tender              Adnexa: no mass, fullness, tenderness               Rectovaginal: Confirms               Anus:  normal sphincter tone, no lesions  Chaperone present: yes  A:  Well Woman with normal exam  Contraception with Nuva Ring History of HSV II culture proven 12/2009 Multiple tattoos History of Hodgkin Lymphoma 02/2006 - Oncologist has left the area S/P breast implants    P:   Reviewed health and wellness pertinent to  exam  Pap smear as above  Refill on Nuva Ring for a year  Follow with labs  Counseled on breast self exam, use and side effects of OCP's, adequate intake of calcium and vitamin D, diet and exercise return annually or prn  An After Visit Summary was printed and given to the patient.

## 2015-08-07 NOTE — Patient Instructions (Signed)

## 2015-08-09 LAB — IPS PAP TEST WITH HPV

## 2015-08-12 NOTE — Progress Notes (Signed)
Encounter reviewed by Dr. Brook Amundson C. Silva.  

## 2015-08-14 LAB — HEMOGLOBIN, FINGERSTICK: Hemoglobin, fingerstick: 13.3 g/dL (ref 12.0–16.0)

## 2016-08-11 ENCOUNTER — Ambulatory Visit: Payer: BLUE CROSS/BLUE SHIELD | Admitting: Nurse Practitioner

## 2016-09-22 ENCOUNTER — Ambulatory Visit: Payer: BLUE CROSS/BLUE SHIELD | Admitting: Nurse Practitioner

## 2016-10-06 ENCOUNTER — Telehealth: Payer: Self-pay | Admitting: Nurse Practitioner

## 2016-10-06 NOTE — Telephone Encounter (Signed)
Left message regarding upcoming appointment has been canceled and needs to be rescheduled. °

## 2016-10-14 ENCOUNTER — Ambulatory Visit: Payer: BLUE CROSS/BLUE SHIELD | Admitting: Nurse Practitioner

## 2016-12-22 ENCOUNTER — Telehealth: Payer: Self-pay | Admitting: Obstetrics and Gynecology

## 2016-12-22 NOTE — Telephone Encounter (Signed)
Spoke with patient, patient is tearful. Reports history of anxiety and depression, has increased over the last year. Has experienced job change, "nothing that significant to cause increased anxiety". Denies suicidal or homicidal ideations. Moved to [place] 5 years ago. Patient previously scheduled for AEX with Dr. Talbert Nan on 12/29/16, declined earlier appointment.   Patient states she has taken Xanax in the past, unsure if this will be an option, or counselor, would like to discuss options? Advised patient to keep AEX as scheduled for further evaluation. Advised patient should symptoms worsen or thoughts of suicide develop, seek care at local ER after hours, call office if earlier appointment needed. Advised patient Dr. Talbert Nan will review, will return call with any additional recommendations. Patient verbalizes understanding and is agreeable.   Last AEX 08/07/15 -Kem Boroughs, NP  Dr. Talbert Nan, any additional recommendations?

## 2016-12-22 NOTE — Telephone Encounter (Signed)
Patient called requesting to speak with the nurse about "concerns about increasing anxiety."

## 2016-12-23 NOTE — Telephone Encounter (Signed)
When she is here we can discuss starting an SSRI, first line treatment for anxiety and depression. I don't prescribe Xanax because of the risk of dependence

## 2016-12-23 NOTE — Telephone Encounter (Signed)
Spoke with patient, advised as seen below per Dr. Talbert Nan. Patient verbalizes understanding and is agreeable.  Patient is agreeable to disposition. Will close encounter.

## 2016-12-24 DIAGNOSIS — F411 Generalized anxiety disorder: Secondary | ICD-10-CM | POA: Insufficient documentation

## 2016-12-29 ENCOUNTER — Ambulatory Visit (INDEPENDENT_AMBULATORY_CARE_PROVIDER_SITE_OTHER): Payer: BLUE CROSS/BLUE SHIELD | Admitting: Obstetrics and Gynecology

## 2016-12-29 ENCOUNTER — Encounter: Payer: Self-pay | Admitting: Obstetrics and Gynecology

## 2016-12-29 VITALS — BP 118/70 | HR 76 | Resp 16 | Ht 60.5 in | Wt 113.8 lb

## 2016-12-29 DIAGNOSIS — Z0000 Encounter for general adult medical examination without abnormal findings: Secondary | ICD-10-CM | POA: Diagnosis not present

## 2016-12-29 DIAGNOSIS — G43009 Migraine without aura, not intractable, without status migrainosus: Secondary | ICD-10-CM | POA: Diagnosis not present

## 2016-12-29 DIAGNOSIS — F418 Other specified anxiety disorders: Secondary | ICD-10-CM

## 2016-12-29 DIAGNOSIS — Z01419 Encounter for gynecological examination (general) (routine) without abnormal findings: Secondary | ICD-10-CM

## 2016-12-29 MED ORDER — VALACYCLOVIR HCL 500 MG PO TABS: ORAL_TABLET | ORAL | 1 refills | Status: DC

## 2016-12-29 MED ORDER — ETONOGESTREL-ETHINYL ESTRADIOL 0.12-0.015 MG/24HR VA RING: VAGINAL_RING | VAGINAL | 4 refills | Status: DC

## 2016-12-29 MED ORDER — NAPROXEN SODIUM 550 MG PO TABS: 550.0000 mg | ORAL_TABLET | Freq: Two times a day (BID) | ORAL | 1 refills | Status: DC

## 2016-12-29 NOTE — Patient Instructions (Signed)
EXERCISE AND DIET:  We recommended that you start or continue a regular exercise program for good health. Regular exercise means any activity that makes your heart beat faster and makes you sweat.  We recommend exercising at least 30 minutes per day at least 3 days a week, preferably 4 or 5.  We also recommend a diet low in fat and sugar.  Inactivity, poor dietary choices and obesity can cause diabetes, heart attack, stroke, and kidney damage, among others.    ALCOHOL AND SMOKING:  Women should limit their alcohol intake to no more than 7 drinks/beers/glasses of wine (combined, not each!) per week. Moderation of alcohol intake to this level decreases your risk of breast cancer and liver damage. And of course, no recreational drugs are part of a healthy lifestyle.  And absolutely no smoking or even second hand smoke. Most people know smoking can cause heart and lung diseases, but did you know it also contributes to weakening of your bones? Aging of your skin?  Yellowing of your teeth and nails?  CALCIUM AND VITAMIN D:  Adequate intake of calcium and Vitamin D are recommended.  The recommendations for exact amounts of these supplements seem to change often, but generally speaking 600 mg of calcium (either carbonate or citrate) and 800 units of Vitamin D per day seems prudent. Certain women may benefit from higher intake of Vitamin D.  If you are among these women, your doctor will have told you during your visit.    PAP SMEARS:  Pap smears, to check for cervical cancer or precancers,  have traditionally been done yearly, although recent scientific advances have shown that most women can have pap smears less often.  However, every woman still should have a physical exam from her gynecologist every year. It will include a breast check, inspection of the vulva and vagina to check for abnormal growths or skin changes, a visual exam of the cervix, and then an exam to evaluate the size and shape of the uterus and  ovaries.  And after 29 years of age, a rectal exam is indicated to check for rectal cancers. We will also provide age appropriate advice regarding health maintenance, like when you should have certain vaccines, screening for sexually transmitted diseases, bone density testing, colonoscopy, mammograms, etc.   MAMMOGRAMS:  All women over 40 years old should have a yearly mammogram. Many facilities now offer a "3D" mammogram, which may cost around $50 extra out of pocket. If possible,  we recommend you accept the option to have the 3D mammogram performed.  It both reduces the number of women who will be called back for extra views which then turn out to be normal, and it is better than the routine mammogram at detecting truly abnormal areas.    COLONOSCOPY:  Colonoscopy to screen for colon cancer is recommended for all women at age 50.  We know, you hate the idea of the prep.  We agree, BUT, having colon cancer and not knowing it is worse!!  Colon cancer so often starts as a polyp that can be seen and removed at colonscopy, which can quite literally save your life!  And if your first colonoscopy is normal and you have no family history of colon cancer, most women don't have to have it again for 10 years.  Once every ten years, you can do something that may end up saving your life, right?  We will be happy to help you get it scheduled when you are ready.    Be sure to check your insurance coverage so you understand how much it will cost.  It may be covered as a preventative service at no cost, but you should check your particular policy.      Breast Self-Awareness Breast self-awareness means being familiar with how your breasts look and feel. It involves checking your breasts regularly and reporting any changes to your health care provider. Practicing breast self-awareness is important. A change in your breasts can be a sign of a serious medical problem. Being familiar with how your breasts look and feel allows  you to find any problems early, when treatment is more likely to be successful. All women should practice breast self-awareness, including women who have had breast implants. How to do a breast self-exam One way to learn what is normal for your breasts and whether your breasts are changing is to do a breast self-exam. To do a breast self-exam: Look for Changes  1. Remove all the clothing above your waist. 2. Stand in front of a mirror in a room with good lighting. 3. Put your hands on your hips. 4. Push your hands firmly downward. 5. Compare your breasts in the mirror. Look for differences between them (asymmetry), such as: ? Differences in shape. ? Differences in size. ? Puckers, dips, and bumps in one breast and not the other. 6. Look at each breast for changes in your skin, such as: ? Redness. ? Scaly areas. 7. Look for changes in your nipples, such as: ? Discharge. ? Bleeding. ? Dimpling. ? Redness. ? A change in position. Feel for Changes  Carefully feel your breasts for lumps and changes. It is best to do this while lying on your back on the floor and again while sitting or standing in the shower or tub with soapy water on your skin. Feel each breast in the following way:  Place the arm on the side of the breast you are examining above your head.  Feel your breast with the other hand.  Start in the nipple area and make  inch (2 cm) overlapping circles to feel your breast. Use the pads of your three middle fingers to do this. Apply light pressure, then medium pressure, then firm pressure. The light pressure will allow you to feel the tissue closest to the skin. The medium pressure will allow you to feel the tissue that is a little deeper. The firm pressure will allow you to feel the tissue close to the ribs.  Continue the overlapping circles, moving downward over the breast until you feel your ribs below your breast.  Move one finger-width toward the center of the body.  Continue to use the  inch (2 cm) overlapping circles to feel your breast as you move slowly up toward your collarbone.  Continue the up and down exam using all three pressures until you reach your armpit.  Write Down What You Find  Write down what is normal for each breast and any changes that you find. Keep a written record with breast changes or normal findings for each breast. By writing this information down, you do not need to depend only on memory for size, tenderness, or location. Write down where you are in your menstrual cycle, if you are still menstruating. If you are having trouble noticing differences in your breasts, do not get discouraged. With time you will become more familiar with the variations in your breasts and more comfortable with the exam. How often should I examine my breasts? Examine   your breasts every month. If you are breastfeeding, the best time to examine your breasts is after a feeding or after using a breast pump. If you menstruate, the best time to examine your breasts is 5-7 days after your period is over. During your period, your breasts are lumpier, and it may be more difficult to notice changes. When should I see my health care provider? See your health care provider if you notice:  A change in shape or size of your breasts or nipples.  A change in the skin of your breast or nipples, such as a reddened or scaly area.  Unusual discharge from your nipples.  A lump or thick area that was not there before.  Pain in your breasts.  Anything that concerns you.  This information is not intended to replace advice given to you by your health care provider. Make sure you discuss any questions you have with your health care provider. Document Released: 03/24/2005 Document Revised: 08/30/2015 Document Reviewed: 02/11/2015 Elsevier Interactive Patient Education  2018 [employer]  

## 2016-12-29 NOTE — Progress Notes (Signed)
31 y.o. G1P0010 SingleCaucasianF here for annual exam.   She gets frequent headache, gets migraines without aura a few days. Not cyclic. Cycles are light, cramps vary, can be bad.  Period Cycle (Days): 28 Period Duration (Days): 4-5 Period Pattern: Regular Menstrual Flow: Light Menstrual Control: Tampon Menstrual Control Change Freq (Hours): 12 Dysmenorrhea: (!) Moderate Dysmenorrhea Symptoms: Cramping, Headache (occasional abdominal pain)  Sexually active, same partner x 6 years, live together. No dyspareunia.  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 Treated with chemo, fine since then.  She just started on Effexor for depression and severe anxiety. Has been on and off anti-depressants since she was 10.   Patient's last menstrual period was 12/28/2016.          Sexually active: Yes.    The current method of family planning is NuvaRing vaginal inserts.    Exercising: Yes.    weight lifting, yoga, 4-5 times a week Smoker:  no  Health Maintenance: Pap:  08/07/15 WNL History of abnormal Pap:  yes MMG:  n/a Colonoscopy:  n/a BMD:   n/a TDaP:  2016 Gardasil: no   reports that she has never smoked. She has never used smokeless tobacco. She reports that she drinks about 0.5 oz of alcohol per week . She reports that she does not use drugs. 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 Asthma   . Depression with anxiety   . Hodgkin lymphoma ([place]) 11/07   Dr. Rockwell [place]  . HSV-2 infection    C&S  . Migraine without aura     Past Surgical History:  Procedure Laterality Date  . BREAST SURGERY  04/2010   breast implants    Current Outpatient Prescriptions  Medication Sig Dispense Refill  . ALPRAZolam (XANAX PO) Take by mouth as needed.    . etonogestrel-ethinyl estradiol (NUVARING) 0.12-0.015 MG/24HR vaginal ring Insert vaginally and leave in place for 3 consecutive weeks, then remove for 1 week. 3 each 4  .  valACYclovir (VALTREX) 1000 MG tablet 1/2 tablet daily and twice a day for flare X 5 days 90 tablet 3  . venlafaxine XR (EFFEXOR-XR) 37.5 MG 24 hr capsule Take 1 capsule by mouth daily.     No current facility-administered medications for this visit.     Family History  Problem Relation Age of Onset  . Hypertension Father   . Anxiety disorder Father     Review of Systems  Constitutional: Negative.   HENT: Negative.   Eyes: Negative.   Respiratory: Negative.   Cardiovascular: Negative.   Gastrointestinal: Negative.   Endocrine: Negative.   Genitourinary: Negative.   Musculoskeletal: Negative.   Skin: Negative.   Allergic/Immunologic: Negative.   Neurological: Negative.   Hematological: Negative.   Psychiatric/Behavioral: Negative.     Exam:   BP 118/70 (BP Location: Right Arm, Patient Position: Sitting, Cuff Size: Normal)   Pulse 76   Resp 16   Ht 5' 0.5" (1.537 m)   Wt 113 lb 12.8 oz (51.6 kg)   LMP 12/28/2016   BMI 21.86 kg/m   Weight change: @WEIGHTCHANGE @ Height:   Height: 5' 0.5" (153.7 cm)  Ht Readings from Last 3 Encounters:  12/29/16 5' 0.5" (1.537 m)  08/07/15 5' 1.25" (1.556 m)  09/27/14 5\' 1"  (1.549 m)    General appearance: alert, cooperative and appears stated age Head: Normocephalic, without obvious abnormality, atraumatic Neck: no adenopathy, supple, symmetrical, trachea midline and thyroid  normal to inspection and palpation Lungs: clear to auscultation bilaterally Cardiovascular: regular rate and rhythm Breasts: normal appearance, no masses or tenderness Abdomen: soft, non-tender; non distended,  no masses,  no organomegaly Extremities: extremities normal, atraumatic, no cyanosis or edema Skin: Skin color, texture, turgor normal. No rashes or lesions Lymph nodes: Cervical, supraclavicular, and axillary nodes normal. No abnormal inguinal nodes palpated Neurologic: Grossly normal   Pelvic: External genitalia:  no lesions              Urethra:   normal appearing urethra with no masses, tenderness or lesions              Bartholins and Skenes: normal                 Vagina: normal appearing vagina with normal color and discharge, no lesions              Cervix: no lesions               Bimanual Exam:  Uterus:  normal size, contour, position, consistency, mobility, non-tender              Adnexa: no mass, fullness, tenderness               Rectovaginal: Confirms               Anus:  normal sphincter tone, no lesions  Chaperone was present for exam.  A:  Well Woman with normal exam  Nuvaring  Dysmenorrhea   P:   Start taking the Nuvaring continuously  Anaprox for Cramps  Valtrex for prn use  Screening labs  Pap in 2020  Discussed breast self exam  Discussed calcium and vit D intake

## 2016-12-30 LAB — COMPREHENSIVE METABOLIC PANEL
ALBUMIN: 4.4 g/dL (ref 3.5–5.5)
ALK PHOS: 57 IU/L (ref 39–117)
ALT: 16 IU/L (ref 0–32)
AST: 19 IU/L (ref 0–40)
Albumin/Globulin Ratio: 1.7 (ref 1.2–2.2)
BUN / CREAT RATIO: 24 — AB (ref 9–23)
BUN: 17 mg/dL (ref 6–20)
Bilirubin Total: 0.5 mg/dL (ref 0.0–1.2)
CO2: 22 mmol/L (ref 20–29)
CREATININE: 0.71 mg/dL (ref 0.57–1.00)
Calcium: 9.3 mg/dL (ref 8.7–10.2)
Chloride: 101 mmol/L (ref 96–106)
GFR calc Af Amer: 133 mL/min/{1.73_m2} (ref >59)
GFR calc non Af Amer: 115 mL/min/{1.73_m2} (ref >59)
GLUCOSE: 74 mg/dL (ref 65–99)
Globulin, Total: 2.6 g/dL (ref 1.5–4.5)
Potassium: 4.5 mmol/L (ref 3.5–5.2)
Sodium: 139 mmol/L (ref 134–144)
Total Protein: 7 g/dL (ref 6.0–8.5)

## 2016-12-30 LAB — LIPID PANEL
CHOLESTEROL TOTAL: 186 mg/dL (ref 100–199)
Chol/HDL Ratio: 2.1 ratio (ref 0.0–4.4)
HDL: 88 mg/dL (ref >39)
LDL CALC: 88 mg/dL (ref 0–99)
TRIGLYCERIDES: 50 mg/dL (ref 0–149)
VLDL CHOLESTEROL CAL: 10 mg/dL (ref 5–40)

## 2016-12-30 LAB — CBC
Hematocrit: 39.6 % (ref 34.0–46.6)
Hemoglobin: 13 g/dL (ref 11.1–15.9)
MCH: 31 pg (ref 26.6–33.0)
MCHC: 32.8 g/dL (ref 31.5–35.7)
MCV: 94 fL (ref 79–97)
PLATELETS: 241 10*3/uL (ref 150–379)
RBC: 4.2 x10E6/uL (ref 3.77–5.28)
RDW: 12.6 % (ref 12.3–15.4)
WBC: 7.1 10*3/uL (ref 3.4–10.8)

## 2017-08-14 DIAGNOSIS — H93293 Other abnormal auditory perceptions, bilateral: Secondary | ICD-10-CM | POA: Insufficient documentation

## 2018-03-18 ENCOUNTER — Other Ambulatory Visit: Payer: Self-pay | Admitting: Obstetrics and Gynecology

## 2018-03-18 MED ORDER — ETONOGESTREL-ETHINYL ESTRADIOL 0.12-0.015 MG/24HR VA RING: VAGINAL_RING | VAGINAL | 0 refills | Status: DC

## 2018-03-18 NOTE — Telephone Encounter (Signed)
Medication refill request: Nuvaring  Last AEX:  12/29/16 Next AEX: 04/01/18 Last MMG (if hormonal medication request): NA Refill authorized:  #1 with 0RF

## 2018-03-18 NOTE — Telephone Encounter (Signed)
Patient requesting a refill on nuvaring. New pharmacy is [facility] on [address] in [place]. Phone [phone]. Aex scheduled 04/01/18

## 2018-04-01 ENCOUNTER — Encounter: Payer: Self-pay | Admitting: Obstetrics and Gynecology

## 2018-04-01 ENCOUNTER — Ambulatory Visit: Payer: BLUE CROSS/BLUE SHIELD | Admitting: Obstetrics and Gynecology

## 2018-04-01 NOTE — Progress Notes (Deleted)
32 y.o. G5P0010 Single White or Caucasian Not Hispanic or Latino female here for annual exam.      No LMP recorded.          Sexually active: {yes no:314532}  The current method of family planning is {contraception:315051}.    Exercising: {yes no:314532}  {types:19826} Smoker:  {YES [id number]  Health Maintenance: Pap:  08/07/15 WNL, 06/21/2013 WNL History of abnormal Pap:   TDaP:  06/26/2014 Gardasil: No   reports that she has never smoked. She has never used smokeless tobacco. She reports current alcohol use of about 1.0 standard drinks of alcohol per week. She reports that she does not use drugs.  Past Medical History:  Diagnosis Date  . Asthma   . Depression with anxiety   . Hodgkin lymphoma ([place]) 11/07   Dr. Rockwell [place]  . HSV-2 infection    C&S  . Migraine without aura     Past Surgical History:  Procedure Laterality Date  . BREAST SURGERY  04/2010   breast implants    Current Outpatient Medications  Medication Sig Dispense Refill  . ALPRAZolam (XANAX PO) Take by mouth as needed.    . etonogestrel-ethinyl estradiol (NUVARING) 0.12-0.015 MG/24HR vaginal ring Insert vaginally and leave in place for 4 consecutive weeks, then remove and replace with a new ring. 1 each 0  . naproxen sodium (ANAPROX DS) 550 MG tablet Take 1 tablet (550 mg total) by mouth 2 (two) times daily with a meal. 30 tablet 1  . valACYclovir (VALTREX) 500 MG tablet Take one tablet BID for 3 days as needed 30 tablet 1  . venlafaxine XR (EFFEXOR-XR) 37.5 MG 24 hr capsule Take 1 capsule by mouth daily.     No current facility-administered medications for this visit.     Family History  Problem Relation Age of Onset  . Hypertension Father   . Anxiety disorder Father     Review of Systems  Exam:   There were no vitals taken for this visit.  Weight change: @WEIGHTCHANGE @ Height:      Ht Readings from Last 3 Encounters:  12/29/16 5' 0.5" (1.537 m)  08/07/15 5' 1.25" (1.556 m)  09/27/14 5\' 1"  (1.549 m)     General appearance: alert, cooperative and appears stated age Head: Normocephalic, without obvious abnormality, atraumatic Neck: no adenopathy, supple, symmetrical, trachea midline and thyroid {CHL AMB PHY EX THYROID NORM DEFAULT:[phone]::"normal to inspection and palpation"} Lungs: clear to auscultation bilaterally Cardiovascular: regular rate and rhythm Breasts: {Exam; breast:13139::"normal appearance, no masses or tenderness"} Abdomen: soft, non-tender; non distended,  no masses,  no organomegaly Extremities: extremities normal, atraumatic, no cyanosis or edema Skin: Skin color, texture, turgor normal. No rashes or lesions Lymph nodes: Cervical, supraclavicular, and axillary nodes normal. No abnormal inguinal nodes palpated Neurologic: Grossly normal   Pelvic: External genitalia:  no lesions              Urethra:  normal appearing urethra with no masses, tenderness or lesions              Bartholins and Skenes: normal                 Vagina: normal appearing vagina with normal color and discharge, no lesions              Cervix: {CHL AMB PHY EX CERVIX NORM DEFAULT:[phone]::"no lesions"}               Bimanual Exam:  Uterus:  {CHL AMB PHY EX UTERUS NORM  DEFAULT:[phone]::"normal size, contour, position, consistency, mobility, non-tender"}              Adnexa: {CHL AMB PHY EX ADNEXA NO MASS [phone]::"no mass, fullness, tenderness"}               Rectovaginal: Confirms               Anus:  normal sphincter tone, no lesions  Chaperone was present for exam.  A:  Well Woman with normal exam  P:

## 2018-04-11 ENCOUNTER — Other Ambulatory Visit: Payer: Self-pay | Admitting: Obstetrics and Gynecology

## 2018-04-12 NOTE — Telephone Encounter (Signed)
Medication refill request: NuvaRing Last AEX:  12/29/16 JJ Next AEX: 04/27/18 Last MMG (if hormonal medication request): n/a Refill authorized: 03/18/18 #1 w/0 refills; prescription quantity and refills have been changed in the order to get patient to AEX; please advise

## 2018-04-26 NOTE — Progress Notes (Deleted)
33 y.o. G73P0010 Single White or Caucasian Not Hispanic or Latino female here for annual exam.      No LMP recorded.          Sexually active: {yes no:314532}  The current method of family planning is {contraception:315051}.    Exercising: {yes no:314532}  {types:19826} Smoker:  {YES [id number]  Health Maintenance: Pap:  08/07/15 WNL History of abnormal Pap:  yes MMG:  n/a Colonoscopy:  n/a BMD:   n/a TDaP:  06/26/2014 Gardasil: no   reports that she has never smoked. She has never used smokeless tobacco. She reports current alcohol use of about 1.0 standard drinks of alcohol per week. She reports that she does not use drugs.  Past Medical History:  Diagnosis Date  . Asthma   . Depression with anxiety   . Hodgkin lymphoma ([place]) 11/07   Dr. Rockwell [place]  . HSV-2 infection    C&S  . Migraine without aura     Past Surgical History:  Procedure Laterality Date  . BREAST SURGERY  04/2010   breast implants    Current Outpatient Medications  Medication Sig Dispense Refill  . ALPRAZolam (XANAX PO) Take by mouth as needed.    . etonogestrel-ethinyl estradiol (NUVARING) 0.12-0.015 MG/24HR vaginal ring INSERT VAGINALLY & LEAVE IN PLACE FOR 4 CONSECUTIVE WEEKS, THEN REMOVE AND REPLACE WITH A NEW RING. 1 each 0  . naproxen sodium (ANAPROX DS) 550 MG tablet Take 1 tablet (550 mg total) by mouth 2 (two) times daily with a meal. 30 tablet 1  . valACYclovir (VALTREX) 500 MG tablet Take one tablet BID for 3 days as needed 30 tablet 1  . venlafaxine XR (EFFEXOR-XR) 37.5 MG 24 hr capsule Take 1 capsule by mouth daily.     No current facility-administered medications for this visit.     Family History  Problem Relation Age of Onset  . Hypertension Father   . Anxiety disorder Father     Review of Systems  Exam:   There were no vitals taken for this visit.  Weight change: @WEIGHTCHANGE @ Height:      Ht Readings from Last 3 Encounters:  12/29/16 5' 0.5" (1.537 m)  08/07/15 5' 1.25" (1.556 m)   09/27/14 5\' 1"  (1.549 m)    General appearance: alert, cooperative and appears stated age Head: Normocephalic, without obvious abnormality, atraumatic Neck: no adenopathy, supple, symmetrical, trachea midline and thyroid {CHL AMB PHY EX THYROID NORM DEFAULT:[phone]::"normal to inspection and palpation"} Lungs: clear to auscultation bilaterally Cardiovascular: regular rate and rhythm Breasts: {Exam; breast:13139::"normal appearance, no masses or tenderness"} Abdomen: soft, non-tender; non distended,  no masses,  no organomegaly Extremities: extremities normal, atraumatic, no cyanosis or edema Skin: Skin color, texture, turgor normal. No rashes or lesions Lymph nodes: Cervical, supraclavicular, and axillary nodes normal. No abnormal inguinal nodes palpated Neurologic: Grossly normal   Pelvic: External genitalia:  no lesions              Urethra:  normal appearing urethra with no masses, tenderness or lesions              Bartholins and Skenes: normal                 Vagina: normal appearing vagina with normal color and discharge, no lesions              Cervix: {CHL AMB PHY EX CERVIX NORM DEFAULT:[phone]::"no lesions"}               Bimanual Exam:  Uterus:  {CHL AMB PHY EX UTERUS NORM DEFAULT:[phone]::"normal size, contour, position, consistency, mobility, non-tender"}              Adnexa: {CHL AMB PHY EX ADNEXA NO MASS DEFAULT:[phone]::"no mass, fullness, tenderness"}               Rectovaginal: Confirms               Anus:  normal sphincter tone, no lesions  Chaperone was present for exam.  A:  Well Woman with normal exam  P:

## 2018-04-27 ENCOUNTER — Telehealth: Payer: Self-pay | Admitting: Obstetrics and Gynecology

## 2018-04-27 ENCOUNTER — Ambulatory Visit: Payer: BLUE CROSS/BLUE SHIELD | Admitting: Obstetrics and Gynecology

## 2018-04-27 NOTE — Telephone Encounter (Signed)
Patient's mother Susan Hartman called stating that her daughter is still sick with the flu. She rescheduled to 05/04/18 at 9:30 am.

## 2018-05-04 ENCOUNTER — Ambulatory Visit (INDEPENDENT_AMBULATORY_CARE_PROVIDER_SITE_OTHER): Payer: BLUE CROSS/BLUE SHIELD | Admitting: Obstetrics and Gynecology

## 2018-05-04 ENCOUNTER — Encounter: Payer: Self-pay | Admitting: Obstetrics and Gynecology

## 2018-05-04 ENCOUNTER — Other Ambulatory Visit: Payer: Self-pay

## 2018-05-04 ENCOUNTER — Other Ambulatory Visit (HOSPITAL_COMMUNITY)
Admission: RE | Admit: 2018-05-04 | Discharge: 2018-05-04 | Disposition: A | Discharge status: Home / Self Care | Payer: BLUE CROSS/BLUE SHIELD | Source: Ambulatory Visit | Attending: Obstetrics and Gynecology | Admitting: Obstetrics and Gynecology

## 2018-05-04 VITALS — BP 122/84 | HR 84 | Ht 62.75 in | Wt 108.0 lb

## 2018-05-04 DIAGNOSIS — Z01419 Encounter for gynecological examination (general) (routine) without abnormal findings: Secondary | ICD-10-CM | POA: Diagnosis not present

## 2018-05-04 DIAGNOSIS — Z124 Encounter for screening for malignant neoplasm of cervix: Secondary | ICD-10-CM | POA: Insufficient documentation

## 2018-05-04 DIAGNOSIS — R5383 Other fatigue: Secondary | ICD-10-CM

## 2018-05-04 DIAGNOSIS — Z23 Encounter for immunization: Secondary | ICD-10-CM

## 2018-05-04 DIAGNOSIS — Z3044 Encounter for surveillance of vaginal ring hormonal contraceptive device: Secondary | ICD-10-CM

## 2018-05-04 DIAGNOSIS — Z0000 Encounter for general adult medical examination without abnormal findings: Secondary | ICD-10-CM | POA: Diagnosis not present

## 2018-05-04 MED ORDER — ETONOGESTREL-ETHINYL ESTRADIOL 0.12-0.015 MG/24HR VA RING: VAGINAL_RING | VAGINAL | 3 refills | Status: DC

## 2018-05-04 NOTE — Patient Instructions (Signed)
EXERCISE AND DIET:  We recommended that you start or continue a regular exercise program for good health. Regular exercise means any activity that makes your heart beat faster and makes you sweat.  We recommend exercising at least 30 minutes per day at least 3 days a week, preferably 4 or 5.  We also recommend a diet low in fat and sugar.  Inactivity, poor dietary choices and obesity can cause diabetes, heart attack, stroke, and kidney damage, among others.    ALCOHOL AND SMOKING:  Women should limit their alcohol intake to no more than 7 drinks/beers/glasses of wine (combined, not each!) per week. Moderation of alcohol intake to this level decreases your risk of breast cancer and liver damage. And of course, no recreational drugs are part of a healthy lifestyle.  And absolutely no smoking or even second hand smoke. Most people know smoking can cause heart and lung diseases, but did you know it also contributes to weakening of your bones? Aging of your skin?  Yellowing of your teeth and nails?  CALCIUM AND VITAMIN D:  Adequate intake of calcium and Vitamin D are recommended.  The recommendations for exact amounts of these supplements seem to change often, but generally speaking 1,000 mg of calcium (between diet and supplement) and 800 units of Vitamin D per day seems prudent. Certain women may benefit from higher intake of Vitamin D.  If you are among these women, your doctor will have told you during your visit.    PAP SMEARS:  Pap smears, to check for cervical cancer or precancers,  have traditionally been done yearly, although recent scientific advances have shown that most women can have pap smears less often.  However, every woman still should have a physical exam from her gynecologist every year. It will include a breast check, inspection of the vulva and vagina to check for abnormal growths or skin changes, a visual exam of the cervix, and then an exam to evaluate the size and shape of the uterus and  ovaries.  And after 31 years of age, a rectal exam is indicated to check for rectal cancers. We will also provide age appropriate advice regarding health maintenance, like when you should have certain vaccines, screening for sexually transmitted diseases, bone density testing, colonoscopy, mammograms, etc.   MAMMOGRAMS:  All women over 40 years old should have a yearly mammogram. Many facilities now offer a "3D" mammogram, which may cost around $50 extra out of pocket. If possible,  we recommend you accept the option to have the 3D mammogram performed.  It both reduces the number of women who will be called back for extra views which then turn out to be normal, and it is better than the routine mammogram at detecting truly abnormal areas.    COLON CANCER SCREENING: Now recommend starting at age 45. At this time colonoscopy is not covered for routine screening until 50. There are take home tests that can be done between 45-49.   COLONOSCOPY:  Colonoscopy to screen for colon cancer is recommended for all women at age 50.  We know, you hate the idea of the prep.  We agree, BUT, having colon cancer and not knowing it is worse!!  Colon cancer so often starts as a polyp that can be seen and removed at colonscopy, which can quite literally save your life!  And if your first colonoscopy is normal and you have no family history of colon cancer, most women don't have to have it again for   10 years.  Once every ten years, you can do something that may end up saving your life, right?  We will be happy to help you get it scheduled when you are ready.  Be sure to check your insurance coverage so you understand how much it will cost.  It may be covered as a preventative service at no cost, but you should check your particular policy.      Breast Self-Awareness Breast self-awareness means being familiar with how your breasts look and feel. It involves checking your breasts regularly and reporting any changes to your  health care provider. Practicing breast self-awareness is important. A change in your breasts can be a sign of a serious medical problem. Being familiar with how your breasts look and feel allows you to find any problems early, when treatment is more likely to be successful. All women should practice breast self-awareness, including women who have had breast implants. How to do a breast self-exam One way to learn what is normal for your breasts and whether your breasts are changing is to do a breast self-exam. To do a breast self-exam: Look for Changes  1. Remove all the clothing above your waist. 2. Stand in front of a mirror in a room with good lighting. 3. Put your hands on your hips. 4. Push your hands firmly downward. 5. Compare your breasts in the mirror. Look for differences between them (asymmetry), such as: ? Differences in shape. ? Differences in size. ? Puckers, dips, and bumps in one breast and not the other. 6. Look at each breast for changes in your skin, such as: ? Redness. ? Scaly areas. 7. Look for changes in your nipples, such as: ? Discharge. ? Bleeding. ? Dimpling. ? Redness. ? A change in position. Feel for Changes Carefully feel your breasts for lumps and changes. It is best to do this while lying on your back on the floor and again while sitting or standing in the shower or tub with soapy water on your skin. Feel each breast in the following way:  Place the arm on the side of the breast you are examining above your head.  Feel your breast with the other hand.  Start in the nipple area and make  inch (2 cm) overlapping circles to feel your breast. Use the pads of your three middle fingers to do this. Apply light pressure, then medium pressure, then firm pressure. The light pressure will allow you to feel the tissue closest to the skin. The medium pressure will allow you to feel the tissue that is a little deeper. The firm pressure will allow you to feel the tissue  close to the ribs.  Continue the overlapping circles, moving downward over the breast until you feel your ribs below your breast.  Move one finger-width toward the center of the body. Continue to use the  inch (2 cm) overlapping circles to feel your breast as you move slowly up toward your collarbone.  Continue the up and down exam using all three pressures until you reach your armpit.  Write Down What You Find  Write down what is normal for each breast and any changes that you find. Keep a written record with breast changes or normal findings for each breast. By writing this information down, you do not need to depend only on memory for size, tenderness, or location. Write down where you are in your menstrual cycle, if you are still menstruating. If you are having trouble noticing differences   in your breasts, do not get discouraged. With time you will become more familiar with the variations in your breasts and more comfortable with the exam. How often should I examine my breasts? Examine your breasts every month. If you are breastfeeding, the best time to examine your breasts is after a feeding or after using a breast pump. If you menstruate, the best time to examine your breasts is 5-7 days after your period is over. During your period, your breasts are lumpier, and it may be more difficult to notice changes. When should I see my health care provider? See your health care provider if you notice:  A change in shape or size of your breasts or nipples.  A change in the skin of your breast or nipples, such as a reddened or scaly area.  Unusual discharge from your nipples.  A lump or thick area that was not there before.  Pain in your breasts.  Anything that concerns you.  

## 2018-05-04 NOTE — Progress Notes (Signed)
33 y.o. G32P0010 Single White or Caucasian Not Hispanic or Latino female here for annual exam.   She c/o fatigue, low energy. Her body temp is off, either very cold or very hot.  She gets headaches 2 x a week, migraines. Have been worse in the last 6 months. No auras.  She is on Effexor, is helping her. She doesn't feel depressed, OCD is better, less anxious. She does feel more "scatterbrained". Her sleep varies night to night.  Sexually active, infrequent, low sex drive. Same partner x 8 years.   Period Cycle (Days): 28 Period Duration (Days): 4 days Period Pattern: Regular Menstrual Flow: Heavy, Moderate Menstrual Control: Tampon Menstrual Control Change Freq (Hours): changes tampon every 5 hours Dysmenorrhea: (!) Moderate Dysmenorrhea Symptoms: Cramping  Patient's last menstrual period was 04/09/2018 (approximate).          Sexually active: Yes.    The current method of family planning is NuvaRing vaginal inserts.    Exercising: Yes.    weight training Smoker:  no  Health Maintenance: Pap:  08/07/15 WNL History of abnormal Pap:  yes TDaP:  06/26/2014 Gardasil: No   reports that she has never smoked. She has never used smokeless tobacco. She reports current alcohol use of about 1.0 standard drinks of alcohol per week. She reports that she does not use drugs. 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   Past Medical History:  Diagnosis Date  . Asthma   . Depression with anxiety   . Hodgkin lymphoma ([place]) 11/07   Dr. Rockwell [place]  . HSV-2 infection    C&S  . Migraine without aura     Past Surgical History:  Procedure Laterality Date  . BREAST SURGERY  04/2010   breast implants    Current Outpatient Medications  Medication Sig Dispense Refill  . ALPRAZolam (XANAX PO) Take by mouth as needed.    . etonogestrel-ethinyl estradiol (NUVARING) 0.12-0.015 MG/24HR vaginal ring INSERT VAGINALLY & LEAVE IN PLACE FOR 4 CONSECUTIVE WEEKS, THEN REMOVE AND  REPLACE WITH A NEW RING. 1 each 0  . naproxen sodium (ANAPROX DS) 550 MG tablet Take 1 tablet (550 mg total) by mouth 2 (two) times daily with a meal. 30 tablet 1  . valACYclovir (VALTREX) 500 MG tablet Take one tablet BID for 3 days as needed 30 tablet 1  . venlafaxine XR (EFFEXOR-XR) 37.5 MG 24 hr capsule Take 1 capsule by mouth daily.     No current facility-administered medications for this visit.     Family History  Problem Relation Age of Onset  . Hypertension Father   . Anxiety disorder Father     Review of Systems  Constitutional: Negative.   HENT: Negative.   Eyes: Negative.   Respiratory: Negative.   Cardiovascular: Negative.   Gastrointestinal: Negative.   Endocrine: Positive for cold intolerance and heat intolerance.       Craving sweets  Genitourinary:       Loss of sexual interest  Musculoskeletal: Negative.   Skin: Negative.   Allergic/Immunologic: Negative.   Neurological: Positive for headaches.  Hematological: Negative.   Psychiatric/Behavioral: Negative.     Exam:   BP 122/84 (BP Location: Right Arm, Patient Position: Sitting, Cuff Size: Normal)   Pulse 84   Ht 5' 2.75" (1.594 m)   Wt 108 lb (49 kg)   LMP 04/09/2018 (Approximate)   BMI 19.28 kg/m   Weight change: @WEIGHTCHANGE @ Height:   Height: 5' 2.75" (159.4 cm)  Ht  Readings from Last 3 Encounters:  05/04/18 5' 2.75" (1.594 m)  12/29/16 5' 0.5" (1.537 m)  08/07/15 5' 1.25" (1.556 m)    General appearance: alert, cooperative and appears stated age Head: Normocephalic, without obvious abnormality, atraumatic Neck: no adenopathy, supple, symmetrical, trachea midline and thyroid normal to inspection and palpation Lungs: clear to auscultation bilaterally Cardiovascular: regular rate and rhythm Breasts: normal appearance, no masses or tenderness, bilateral breast implants Abdomen: soft, non-tender; non distended,  no masses,  no organomegaly Extremities: extremities normal, atraumatic, no cyanosis  or edema Skin: Skin color, texture, turgor normal. No rashes or lesions Lymph nodes: Cervical, supraclavicular, and axillary nodes normal. No abnormal inguinal nodes palpated Neurologic: Grossly normal   Pelvic: External genitalia:  no lesions              Urethra:  normal appearing urethra with no masses, tenderness or lesions              Bartholins and Skenes: normal                 Vagina: normal appearing vagina with normal color and discharge, no lesions              Cervix: no cervical motion tenderness and no lesions               Bimanual Exam:  Uterus:  normal size, contour, position, consistency, mobility, non-tender              Adnexa: no mass, fullness, tenderness               Rectovaginal: Confirms               Anus:  normal sphincter tone, no lesions  Chaperone was present for exam.  A:  Well Woman with normal exam  C/O fatigue and an increase in migraines (no aura)  Low libido  P:   Pap with hpv  Screening labs, TSH  Continue nuvaring  F/U with primary for headaches  Start gardasil  Information on libido given

## 2018-05-05 LAB — CBC
Hematocrit: 41.8 % (ref 34.0–46.6)
Hemoglobin: 13.7 g/dL (ref 11.1–15.9)
MCH: 30.7 pg (ref 26.6–33.0)
MCHC: 32.8 g/dL (ref 31.5–35.7)
MCV: 94 fL (ref 79–97)
PLATELETS: 273 10*3/uL (ref 150–450)
RBC: 4.46 x10E6/uL (ref 3.77–5.28)
RDW: 11.9 % (ref 11.7–15.4)
WBC: 4.5 10*3/uL (ref 3.4–10.8)

## 2018-05-05 LAB — LIPID PANEL
CHOLESTEROL TOTAL: 246 mg/dL — AB (ref 100–199)
Chol/HDL Ratio: 2.9 ratio (ref 0.0–4.4)
HDL: 86 mg/dL (ref >39)
LDL CALC: 142 mg/dL — AB (ref 0–99)
TRIGLYCERIDES: 91 mg/dL (ref 0–149)
VLDL CHOLESTEROL CAL: 18 mg/dL (ref 5–40)

## 2018-05-05 LAB — COMPREHENSIVE METABOLIC PANEL
ALK PHOS: 70 IU/L (ref 39–117)
ALT: 12 IU/L (ref 0–32)
AST: 14 IU/L (ref 0–40)
Albumin/Globulin Ratio: 1.6 (ref 1.2–2.2)
Albumin: 4.4 g/dL (ref 3.9–5.0)
BUN/Creatinine Ratio: 17 (ref 9–23)
BUN: 13 mg/dL (ref 6–20)
Bilirubin Total: 0.6 mg/dL (ref 0.0–1.2)
CO2: 21 mmol/L (ref 20–29)
CREATININE: 0.76 mg/dL (ref 0.57–1.00)
Calcium: 9.7 mg/dL (ref 8.7–10.2)
Chloride: 102 mmol/L (ref 96–106)
GFR calc Af Amer: 122 mL/min/{1.73_m2} (ref >59)
GFR calc non Af Amer: 106 mL/min/{1.73_m2} (ref >59)
GLUCOSE: 79 mg/dL (ref 65–99)
Globulin, Total: 2.8 g/dL (ref 1.5–4.5)
Potassium: 4.9 mmol/L (ref 3.5–5.2)
Sodium: 139 mmol/L (ref 134–144)
TOTAL PROTEIN: 7.2 g/dL (ref 6.0–8.5)

## 2018-05-05 LAB — TSH: TSH: 0.854 u[IU]/mL (ref 0.450–4.500)

## 2018-05-06 LAB — CYTOLOGY - PAP
Diagnosis: NEGATIVE
HPV (WINDOPATH): NOT DETECTED

## 2019-06-30 ENCOUNTER — Other Ambulatory Visit: Payer: Self-pay

## 2019-06-30 NOTE — Telephone Encounter (Signed)
Patient called for a refill on Nuvaring birth control.

## 2019-07-01 MED ORDER — ETONOGESTREL-ETHINYL ESTRADIOL 0.12-0.015 MG/24HR VA RING: VAGINAL_RING | VAGINAL | 0 refills | Status: DC

## 2019-07-01 NOTE — Telephone Encounter (Signed)
Medication refill request: NuvaRing  Last AEX:  05/04/18 Next AEX: 07/13/19 Last MMG (if hormonal medication request): NA Refill authorized: #1 with 0 RF

## 2019-07-04 NOTE — Telephone Encounter (Signed)
Left message letting her know her rx was sent to her pharmacy.

## 2019-07-11 NOTE — Progress Notes (Signed)
34 y.o. G1P0010 Single White or Caucasian Not Hispanic or Latino female here for annual exam.  She is using the nuvaring.  Same long term partner. No dyspareunia. No libido. She is on Effexor for depression and anxiety. Got engaged, getting married next year.  Period Cycle (Days): 28 Period Duration (Days): 4 Period Pattern: Regular Menstrual Flow: Moderate Menstrual Control: Tampon Menstrual Control Change Freq (Hours): 8 Dysmenorrhea: (!) Moderate Dysmenorrhea Symptoms: Headache, Nausea, Cramping   She had covid in 1/21, still doesn't have her sense of smell.   Patient's last menstrual period was 06/13/2019 (approximate).          Sexually active: Yes.    The current method of family planning is nuvaring    Exercising: Yes.    weights and yoga Smoker:  no  Health Maintenance: Pap:05/04/18 WNL, negative HR HPV, 08/07/15 WNL History of abnormal Pap:yes TDaP:06/26/2014 Gardasil: 05/04/18    reports that she has never smoked. She has never used smokeless tobacco. She reports current alcohol use of about 1.0 standard drinks of alcohol per week. She reports that she does not use drugs. Realtor, enjoys it.   Past Medical History:  Diagnosis Date  . Asthma   . Depression with anxiety   . Hodgkin lymphoma ([place]) 11/07   Dr. Rockwell [place]  . HSV-2 infection    C&S  . Migraine without aura   No radiation with her Hodgkins, just chemotherapy.   Past Surgical History:  Procedure Laterality Date  . BREAST SURGERY  04/2010   breast implants    Current Outpatient Medications  Medication Sig Dispense Refill  . ALPRAZolam (XANAX PO) Take by mouth as needed.    . etonogestrel-ethinyl estradiol (NUVARING) 0.12-0.015 MG/24HR vaginal ring INSERT VAGINALLY & LEAVE IN PLACE FOR 4 CONSECUTIVE WEEKS, THEN REMOVE AND REPLACE WITH A NEW RING. 1 each 0  . naproxen sodium (ANAPROX DS) 550 MG tablet Take 1 tablet (550 mg total) by mouth 2 (two) times daily with a meal. 30 tablet 1  . valACYclovir  (VALTREX) 500 MG tablet Take one tablet BID for 3 days as needed 30 tablet 1  . venlafaxine XR (EFFEXOR-XR) 37.5 MG 24 hr capsule Take 1 capsule by mouth daily.     No current facility-administered medications for this visit.    Family History  Problem Relation Age of Onset  . Hypertension Father   . Anxiety disorder Father     Review of Systems  All other systems reviewed and are negative.   Exam:   BP 122/68   Pulse 80   Temp 98.2 F (36.8 C)   Ht 5' 0.75" (1.543 m)   Wt 103 lb (46.7 kg)   LMP 06/13/2019 (Approximate)   SpO2 95%   BMI 19.62 kg/m   Weight change: @WEIGHTCHANGE @ Height:   Height: 5' 0.75" (154.3 cm)  Ht Readings from Last 3 Encounters:  07/13/19 5' 0.75" (1.543 m)  05/04/18 5' 2.75" (1.594 m)  12/29/16 5' 0.5" (1.537 m)    General appearance: alert, cooperative and appears stated age Head: Normocephalic, without obvious abnormality, atraumatic Neck: no adenopathy, supple, symmetrical, trachea midline and thyroid normal to inspection and palpation Lungs: clear to auscultation bilaterally Cardiovascular: regular rate and rhythm Breasts: normal appearance, no masses or tenderness, bilateral implants Abdomen: soft, non-tender; non distended,  no masses,  no organomegaly Extremities: extremities normal, atraumatic, no cyanosis or edema Skin: Skin color, texture, turgor normal. No rashes or lesions Lymph nodes: Cervical, supraclavicular, and axillary nodes normal. No abnormal inguinal  nodes palpated Neurologic: Grossly normal   Pelvic: External genitalia:  no lesions              Urethra:  normal appearing urethra with no masses, tenderness or lesions              Bartholins and Skenes: normal                 Vagina: normal appearing vagina with normal color and discharge, no lesions              Cervix: no lesions               Bimanual Exam:  Uterus:  normal size, contour, position, consistency, mobility, non-tender and anteverted               Adnexa: no mass, fullness, tenderness               Rectovaginal: Confirms               Anus:  normal sphincter tone, no lesions  Gae Dry chaperoned for the exam.  A:  Well Woman with normal exam  H/O depression/anxiety controlled with Effexor  H/O HSV, prn valtrex    P:   No pap today  Nuvaring refilled  Information given on the mirena IUD  Discussed breast self exam  Discussed calcium and vit D intake

## 2019-07-12 ENCOUNTER — Other Ambulatory Visit: Payer: Self-pay

## 2019-07-13 ENCOUNTER — Encounter: Payer: Self-pay | Admitting: Obstetrics and Gynecology

## 2019-07-13 ENCOUNTER — Ambulatory Visit (INDEPENDENT_AMBULATORY_CARE_PROVIDER_SITE_OTHER): Payer: 59 | Admitting: Obstetrics and Gynecology

## 2019-07-13 VITALS — BP 122/68 | HR 80 | Temp 98.2°F | Ht 60.75 in | Wt 103.0 lb

## 2019-07-13 DIAGNOSIS — Z3009 Encounter for other general counseling and advice on contraception: Secondary | ICD-10-CM | POA: Diagnosis not present

## 2019-07-13 DIAGNOSIS — Z0000 Encounter for general adult medical examination without abnormal findings: Secondary | ICD-10-CM | POA: Diagnosis not present

## 2019-07-13 DIAGNOSIS — Z23 Encounter for immunization: Secondary | ICD-10-CM | POA: Diagnosis not present

## 2019-07-13 DIAGNOSIS — Z01419 Encounter for gynecological examination (general) (routine) without abnormal findings: Secondary | ICD-10-CM | POA: Diagnosis not present

## 2019-07-13 MED ORDER — ETONOGESTREL-ETHINYL ESTRADIOL 0.12-0.015 MG/24HR VA RING: VAGINAL_RING | VAGINAL | 3 refills | Status: DC

## 2019-07-13 MED ORDER — VALACYCLOVIR HCL 500 MG PO TABS: ORAL_TABLET | ORAL | 1 refills | Status: AC

## 2019-07-13 MED ORDER — VENLAFAXINE HCL ER 37.5 MG PO CP24: 37.5000 mg | ORAL_CAPSULE | Freq: Every day | ORAL | 3 refills | Status: DC

## 2019-07-13 NOTE — Patient Instructions (Signed)
EXERCISE AND DIET:  We recommended that you start or continue a regular exercise program for good health. Regular exercise means any activity that makes your heart beat faster and makes you sweat.  We recommend exercising at least 30 minutes per day at least 3 days a week, preferably 4 or 5.  We also recommend a diet low in fat and sugar.  Inactivity, poor dietary choices and obesity can cause diabetes, heart attack, stroke, and kidney damage, among others.    ALCOHOL AND SMOKING:  Women should limit their alcohol intake to no more than 7 drinks/beers/glasses of wine (combined, not each!) per week. Moderation of alcohol intake to this level decreases your risk of breast cancer and liver damage. And of course, no recreational drugs are part of a healthy lifestyle.  And absolutely no smoking or even second hand smoke. Most people know smoking can cause heart and lung diseases, but did you know it also contributes to weakening of your bones? Aging of your skin?  Yellowing of your teeth and nails?  CALCIUM AND VITAMIN D:  Adequate intake of calcium and Vitamin D are recommended.  The recommendations for exact amounts of these supplements seem to change often, but generally speaking 1,000 mg of calcium (between diet and supplement) and 800 units of Vitamin D per day seems prudent. Certain women may benefit from higher intake of Vitamin D.  If you are among these women, your doctor will have told you during your visit.    PAP SMEARS:  Pap smears, to check for cervical cancer or precancers,  have traditionally been done yearly, although recent scientific advances have shown that most women can have pap smears less often.  However, every woman still should have a physical exam from her gynecologist every year. It will include a breast check, inspection of the vulva and vagina to check for abnormal growths or skin changes, a visual exam of the cervix, and then an exam to evaluate the size and shape of the uterus and  ovaries.  And after 32 years of age, a rectal exam is indicated to check for rectal cancers. We will also provide age appropriate advice regarding health maintenance, like when you should have certain vaccines, screening for sexually transmitted diseases, bone density testing, colonoscopy, mammograms, etc.   MAMMOGRAMS:  All women over 40 years old should have a yearly mammogram. Many facilities now offer a "3D" mammogram, which may cost around $50 extra out of pocket. If possible,  we recommend you accept the option to have the 3D mammogram performed.  It both reduces the number of women who will be called back for extra views which then turn out to be normal, and it is better than the routine mammogram at detecting truly abnormal areas.    COLON CANCER SCREENING: Now recommend starting at age 45. At this time colonoscopy is not covered for routine screening until 50. There are take home tests that can be done between 45-49.   COLONOSCOPY:  Colonoscopy to screen for colon cancer is recommended for all women at age 50.  We know, you hate the idea of the prep.  We agree, BUT, having colon cancer and not knowing it is worse!!  Colon cancer so often starts as a polyp that can be seen and removed at colonscopy, which can quite literally save your life!  And if your first colonoscopy is normal and you have no family history of colon cancer, most women don't have to have it again for   10 years.  Once every ten years, you can do something that may end up saving your life, right?  We will be happy to help you get it scheduled when you are ready.  Be sure to check your insurance coverage so you understand how much it will cost.  It may be covered as a preventative service at no cost, but you should check your particular policy.      Breast Self-Awareness Breast self-awareness means being familiar with how your breasts look and feel. It involves checking your breasts regularly and reporting any changes to your  health care provider. Practicing breast self-awareness is important. A change in your breasts can be a sign of a serious medical problem. Being familiar with how your breasts look and feel allows you to find any problems early, when treatment is more likely to be successful. All women should practice breast self-awareness, including women who have had breast implants. How to do a breast self-exam One way to learn what is normal for your breasts and whether your breasts are changing is to do a breast self-exam. To do a breast self-exam: Look for Changes  1. Remove all the clothing above your waist. 2. Stand in front of a mirror in a room with good lighting. 3. Put your hands on your hips. 4. Push your hands firmly downward. 5. Compare your breasts in the mirror. Look for differences between them (asymmetry), such as: ? Differences in shape. ? Differences in size. ? Puckers, dips, and bumps in one breast and not the other. 6. Look at each breast for changes in your skin, such as: ? Redness. ? Scaly areas. 7. Look for changes in your nipples, such as: ? Discharge. ? Bleeding. ? Dimpling. ? Redness. ? A change in position. Feel for Changes Carefully feel your breasts for lumps and changes. It is best to do this while lying on your back on the floor and again while sitting or standing in the shower or tub with soapy water on your skin. Feel each breast in the following way:  Place the arm on the side of the breast you are examining above your head.  Feel your breast with the other hand.  Start in the nipple area and make  inch (2 cm) overlapping circles to feel your breast. Use the pads of your three middle fingers to do this. Apply light pressure, then medium pressure, then firm pressure. The light pressure will allow you to feel the tissue closest to the skin. The medium pressure will allow you to feel the tissue that is a little deeper. The firm pressure will allow you to feel the tissue  close to the ribs.  Continue the overlapping circles, moving downward over the breast until you feel your ribs below your breast.  Move one finger-width toward the center of the body. Continue to use the  inch (2 cm) overlapping circles to feel your breast as you move slowly up toward your collarbone.  Continue the up and down exam using all three pressures until you reach your armpit.  Write Down What You Find  Write down what is normal for each breast and any changes that you find. Keep a written record with breast changes or normal findings for each breast. By writing this information down, you do not need to depend only on memory for size, tenderness, or location. Write down where you are in your menstrual cycle, if you are still menstruating. If you are having trouble noticing differences   in your breasts, do not get discouraged. With time you will become more familiar with the variations in your breasts and more comfortable with the exam. How often should I examine my breasts? Examine your breasts every month. If you are breastfeeding, the best time to examine your breasts is after a feeding or after using a breast pump. If you menstruate, the best time to examine your breasts is 5-7 days after your period is over. During your period, your breasts are lumpier, and it may be more difficult to notice changes. When should I see my health care provider? See your health care provider if you notice:  A change in shape or size of your breasts or nipples.  A change in the skin of your breast or nipples, such as a reddened or scaly area.  Unusual discharge from your nipples.  A lump or thick area that was not there before.  Pain in your breasts.  Anything that concerns you.  

## 2019-07-13 NOTE — Addendum Note (Signed)
Addended by: Dorothy Spark on: 07/13/2019 10:19 AM   Modules accepted: Orders

## 2019-07-14 LAB — COMPREHENSIVE METABOLIC PANEL
ALT: 13 IU/L (ref 0–32)
AST: 14 IU/L (ref 0–40)
Albumin/Globulin Ratio: 1.7 (ref 1.2–2.2)
Albumin: 4.8 g/dL (ref 3.8–4.8)
Alkaline Phosphatase: 67 IU/L (ref 39–117)
BUN/Creatinine Ratio: 16 (ref 9–23)
BUN: 12 mg/dL (ref 6–20)
Bilirubin Total: 0.3 mg/dL (ref 0.0–1.2)
CO2: 23 mmol/L (ref 20–29)
Calcium: 9.8 mg/dL (ref 8.7–10.2)
Chloride: 102 mmol/L (ref 96–106)
Creatinine, Ser: 0.73 mg/dL (ref 0.57–1.00)
GFR calc Af Amer: 127 mL/min/{1.73_m2} (ref >59)
GFR calc non Af Amer: 110 mL/min/{1.73_m2} (ref >59)
Globulin, Total: 2.8 g/dL (ref 1.5–4.5)
Glucose: 94 mg/dL (ref 65–99)
Potassium: 5.3 mmol/L — ABNORMAL HIGH (ref 3.5–5.2)
Sodium: 139 mmol/L (ref 134–144)
Total Protein: 7.6 g/dL (ref 6.0–8.5)

## 2019-07-14 LAB — LIPID PANEL
Chol/HDL Ratio: 2.8 ratio (ref 0.0–4.4)
Cholesterol, Total: 263 mg/dL — ABNORMAL HIGH (ref 100–199)
HDL: 93 mg/dL (ref >39)
LDL Chol Calc (NIH): 159 mg/dL — ABNORMAL HIGH (ref 0–99)
Triglycerides: 70 mg/dL (ref 0–149)
VLDL Cholesterol Cal: 11 mg/dL (ref 5–40)

## 2019-07-14 LAB — CBC
Hematocrit: 42.4 % (ref 34.0–46.6)
Hemoglobin: 14.3 g/dL (ref 11.1–15.9)
MCH: 32.5 pg (ref 26.6–33.0)
MCHC: 33.7 g/dL (ref 31.5–35.7)
MCV: 96 fL (ref 79–97)
Platelets: 298 10*3/uL (ref 150–450)
RBC: 4.4 x10E6/uL (ref 3.77–5.28)
RDW: 12.1 % (ref 11.7–15.4)
WBC: 4.6 10*3/uL (ref 3.4–10.8)

## 2019-07-18 ENCOUNTER — Telehealth: Payer: Self-pay | Admitting: *Deleted

## 2019-07-18 DIAGNOSIS — E7800 Pure hypercholesterolemia, unspecified: Secondary | ICD-10-CM

## 2019-07-18 NOTE — Telephone Encounter (Signed)
-----   Message from Salvadore Dom, MD sent at 07/15/2019  9:34 AM EDT ----- Please inform the patient that her LDL (bad cholesterol) is elevated. Given her small BMI this is likely genetic. This can increase her risk of heart disease in the future. I would recommend she eat a healthy diet low in saturated fats (ie mediterranean diet). She should continue to exercise. And have a fasting lipid panel in 6 months. If her LDL doesn't improve,then she needs to f/u with primary care to discuss further treatment options.

## 2019-07-18 NOTE — Telephone Encounter (Signed)
Message left to return call to Bresha Hosack at [phone].    

## 2019-07-20 NOTE — Telephone Encounter (Signed)
Left message to call Darelle Kings, RN at [facility] [phone].   

## 2019-07-21 MED ORDER — VENLAFAXINE HCL ER 75 MG PO CP24: 75.0000 mg | ORAL_CAPSULE | Freq: Every day | ORAL | 3 refills | Status: DC

## 2019-07-21 NOTE — Telephone Encounter (Addendum)
Spoke with patient, advised of results as seen below per Dr. Talbert Nan. Patient declines to schedule lab at this time, will return call at later date to schedule. Future lab order placed.    Patient states picked up RX for Effexor XR 37.5 mg capsule, sent on 4/7/21Patient states she has been on 75 mg daily for 2 years, previously prescribed by local urgent care, prior to that a family physician. Patient is requesting new RX for Effexor XR 75mg . Patient states she will get very sick if she has to reduce the dose. Advised patient I will review request with Dr. Talbert Nan and our office will f/u with recommendations, patient agreeable.   Dr. Talbert Nan -please advise

## 2019-07-21 NOTE — Telephone Encounter (Signed)
Patient is returning call to Jill. °

## 2019-07-21 NOTE — Telephone Encounter (Signed)
Please let the patient know that the higher dose of effexor has been sent.

## 2019-07-22 NOTE — Telephone Encounter (Signed)
Left detailed message per DPR on cell #.  Name identified  on voicemail. Pt to return call to office with any questions.   Encounter closed.

## 2019-11-14 ENCOUNTER — Ambulatory Visit (INDEPENDENT_AMBULATORY_CARE_PROVIDER_SITE_OTHER): Payer: 59

## 2019-11-14 ENCOUNTER — Other Ambulatory Visit: Payer: Self-pay

## 2019-11-14 VITALS — BP 114/68 | HR 68 | Resp 16 | Wt 104.0 lb

## 2019-11-14 DIAGNOSIS — Z23 Encounter for immunization: Secondary | ICD-10-CM

## 2019-11-14 NOTE — Progress Notes (Signed)
Patient in today for 3rd Gardasil injection.   Contraception: nuvaring LMP: 10-17-2019 Last AEX: 07-13-2019 with [place]  Injection given in right deltoid. Patient tolerated shot well.   Routed to provider for final review.  Encounter closed.

## 2020-06-05 DIAGNOSIS — J302 Other seasonal allergic rhinitis: Secondary | ICD-10-CM | POA: Insufficient documentation

## 2020-06-05 DIAGNOSIS — Z8572 Personal history of non-Hodgkin lymphomas: Secondary | ICD-10-CM | POA: Insufficient documentation

## 2020-07-18 ENCOUNTER — Ambulatory Visit: Payer: 59 | Admitting: Obstetrics and Gynecology

## 2020-10-29 ENCOUNTER — Ambulatory Visit (INDEPENDENT_AMBULATORY_CARE_PROVIDER_SITE_OTHER): Payer: 59 | Admitting: Obstetrics and Gynecology

## 2020-10-29 ENCOUNTER — Other Ambulatory Visit: Payer: Self-pay

## 2020-10-29 ENCOUNTER — Encounter: Payer: Self-pay | Admitting: Obstetrics and Gynecology

## 2020-10-29 VITALS — BP 122/80 | HR 64 | Ht 60.0 in | Wt 106.0 lb

## 2020-10-29 DIAGNOSIS — E7800 Pure hypercholesterolemia, unspecified: Secondary | ICD-10-CM

## 2020-10-29 DIAGNOSIS — Z01419 Encounter for gynecological examination (general) (routine) without abnormal findings: Secondary | ICD-10-CM | POA: Diagnosis not present

## 2020-10-29 DIAGNOSIS — Z3044 Encounter for surveillance of vaginal ring hormonal contraceptive device: Secondary | ICD-10-CM

## 2020-10-29 DIAGNOSIS — R350 Frequency of micturition: Secondary | ICD-10-CM

## 2020-10-29 LAB — URINALYSIS, COMPLETE W/RFL CULTURE
Bacteria, UA: NONE SEEN /HPF
Bilirubin Urine: NEGATIVE
Glucose, UA: NEGATIVE
Hgb urine dipstick: NEGATIVE
Hyaline Cast: NONE SEEN /LPF
Ketones, ur: NEGATIVE
Leukocyte Esterase: NEGATIVE
Nitrites, Initial: NEGATIVE
Protein, ur: NEGATIVE
RBC / HPF: NONE SEEN /HPF (ref 0–2)
Specific Gravity, Urine: 1.025 (ref 1.001–1.035)
WBC, UA: NONE SEEN /HPF (ref 0–5)
pH: 5.5 (ref 5.0–8.0)

## 2020-10-29 LAB — NO CULTURE INDICATED

## 2020-10-29 MED ORDER — ETONOGESTREL-ETHINYL ESTRADIOL 0.12-0.015 MG/24HR VA RING: VAGINAL_RING | VAGINAL | 3 refills | Status: DC

## 2020-10-29 NOTE — Patient Instructions (Signed)

## 2020-10-29 NOTE — Progress Notes (Signed)
35 y.o. G1P0010 Single White or Caucasian Not Hispanic or Latino female here for annual exam.  She is using the nuvaring. Period Cycle (Days): 28 Period Duration (Days): 3-4 Period Pattern: Regular Menstrual Flow: Moderate Menstrual Control: Tampon Menstrual Control Change Freq (Hours): 6 Dysmenorrhea: (!) Moderate Dysmenorrhea Symptoms: Cramping, Headache, Diarrhea, Nausea (She usually have symptoms before her period.)  Getting married in [place] in 9/22.  H/O depression/anxiety, controlled with Effexor. Managed by primary.  H/O HSV, prn use of valtrex. Infrequent outbreaks, none in the last year. Doesn't need a refill.   She c/o bladder pressure for the last few weeks. She has some frequency of urination   Patient's last menstrual period was 10/23/2020.          Sexually active: Yes.    The current method of family planning is NuvaRing vaginal inserts.    Exercising: Yes.     Weight training, Yoga  Smoker:  no  Health Maintenance: Pap:  05/04/18 WNL, negative HR HPV, 08/07/15 WNL History of abnormal Pap:  yes, had a colposcopy in her 20's, f/u was normal. No surgery on her cervix. MMG:  none  BMD:   none Colonoscopy: none  TDaP:  06/26/14  Gardasil: Complete    reports that she has never smoked. She has never used smokeless tobacco. She reports previous alcohol use. She reports that she does not use drugs. Rare ETOH. She works as a Cabin crew  Past Medical History:  Diagnosis Date   Asthma    Depression with anxiety    Hodgkin lymphoma ([place]) 11/07   Dr. Rockwell [place]   HSV-2 infection    C&S   Migraine without aura   No radiation with her Hodgkins, just chemotherapy.  Past Surgical History:  Procedure Laterality Date   BREAST SURGERY  04/2010   breast implants    Current Outpatient Medications  Medication Sig Dispense Refill   ALPRAZolam (XANAX PO) Take by mouth as needed.     etonogestrel-ethinyl estradiol (NUVARING) 0.12-0.015 MG/24HR vaginal ring INSERT VAGINALLY & LEAVE  IN PLACE FOR 4 CONSECUTIVE WEEKS, THEN REMOVE AND REPLACE WITH A NEW RING. 3 each 3   valACYclovir (VALTREX) 500 MG tablet Take one tablet BID for 3 days as needed 30 tablet 1   venlafaxine XR (EFFEXOR XR) 75 MG 24 hr capsule Take 1 capsule (75 mg total) by mouth daily with breakfast. 90 capsule 3   No current facility-administered medications for this visit.    Family History  Problem Relation Age of Onset   Hypertension Father    Anxiety disorder Father     Review of Systems  All other systems reviewed and are negative.  Exam:   BP 122/80   Pulse 64   Ht 5' (1.524 m)   Wt 106 lb (48.1 kg)   LMP 10/23/2020   SpO2 100%   BMI 20.70 kg/m   Weight change: '@WEIGHTCHANGE'$ @ Height:   Height: 5' (152.4 cm)  Ht Readings from Last 3 Encounters:  10/29/20 5' (1.524 m)  07/13/19 5' 0.75" (1.543 m)  05/04/18 5' 2.75" (1.594 m)    General appearance: alert, cooperative and appears stated age Head: Normocephalic, without obvious abnormality, atraumatic Neck: no adenopathy, supple, symmetrical, trachea midline and thyroid normal to inspection and palpation Lungs: clear to auscultation bilaterally Cardiovascular: regular rate and rhythm Breasts: normal appearance, no masses or tenderness, bilateral soft implants Abdomen: soft, non-tender; non distended,  no masses,  no organomegaly Extremities: extremities normal, atraumatic, no cyanosis or edema Skin: Skin color,  texture, turgor normal. No rashes or lesions Lymph nodes: Cervical, supraclavicular, and axillary nodes normal. No abnormal inguinal nodes palpated Neurologic: Grossly normal   Pelvic: External genitalia:  no lesions              Urethra:  normal appearing urethra with no masses, tenderness or lesions              Bartholins and Skenes: normal                 Vagina: normal appearing vagina with normal color and discharge, no lesions              Cervix: no lesions               Bimanual Exam:  Uterus:  normal size,  contour, position, consistency, mobility, non-tender and anteverted              Adnexa: no mass, fullness, tenderness               Rectovaginal: Confirms               Anus:  normal sphincter tone, no lesions  Gae Dry chaperoned for the exam.  1. Well woman exam Discussed breast self exam Discussed calcium and vit D intake  2. Encounter for surveillance of vaginal ring hormonal contraceptive device Doing well - etonogestrel-ethinyl estradiol (NUVARING) 0.12-0.015 MG/24HR vaginal ring; INSERT VAGINALLY & LEAVE IN PLACE FOR 4 CONSECUTIVE WEEKS, THEN REMOVE AND REPLACE WITH A NEW RING.  Dispense: 3 each; Refill: 3  3. Elevated LDL cholesterol level - Lipid panel -Other labs with primary  4. Urinary frequency Check ua, reflex culture

## 2020-10-30 ENCOUNTER — Telehealth: Payer: Self-pay

## 2020-10-30 LAB — LIPID PANEL
Cholesterol: 269 mg/dL — ABNORMAL HIGH (ref <200)
HDL: 79 mg/dL (ref >=50)
LDL Cholesterol (Calc): 171 mg/dL (calc) — ABNORMAL HIGH
Non-HDL Cholesterol (Calc): 190 mg/dL (calc) — ABNORMAL HIGH (ref <130)
Total CHOL/HDL Ratio: 3.4 (calc) (ref <5.0)
Triglycerides: 81 mg/dL (ref <150)

## 2020-10-30 NOTE — Telephone Encounter (Signed)
Opened in error

## 2021-10-28 NOTE — Progress Notes (Unsigned)
36 y.o. G1P0010 married White or Caucasian Not Hispanic or Latino female here for annual exam.  She is taking her ring out every 3-5 weeks. Her period comes a few days after she takes the ring out. Bleeds x 4-5 days, changes a regular tampon 1 x day.  Period Pattern: (!) Irregular Menstrual Flow: Moderate Menstrual Control: Maxi pad, Tampon Dysmenorrhea: (!) Mild Dysmenorrhea Symptoms: Cramping She denies dyspareunia. Notes low libido, her libido was better when she was off of the nuvaring.   She has saline breast implants. Placed 11-12 years ago.   H/O HSV 2, rare outbreaks. Doesn't need valtrex.   H/O depression and anxiety, was having side effects from effexor. Just started lexapro.   Patient's last menstrual period was 09/11/2021.          Sexually active: Yes.    The current method of family planning is NuvaRing vaginal inserts.    Exercising: Yes.    Gym/ health club routine includes cardio. Smoker:  no  Health Maintenance: Pap: 05/04/18 WNL, negative HR HPV; 08/07/15 WNL History of abnormal Pap:  yes, had a colposcopy in her 20's, f/u was normal. No surgery on her cervix. MMG:  n/a BMD:   n/a Colonoscopy: n/a TDaP:  06/26/14  Gardasil: complete    reports that she has never smoked. She has never used smokeless tobacco. She reports current alcohol use. She reports that she does not use drugs. Rare ETOH. She works as a Cabin crew.  Past Medical History:  Diagnosis Date   Asthma    Depression with anxiety    Hodgkin lymphoma ([place]) 11/07   Dr. Rockwell [place]   HSV-2 infection    C&S   Migraine without aura     Past Surgical History:  Procedure Laterality Date   BREAST SURGERY  04/2010   breast implants    Current Outpatient Medications  Medication Sig Dispense Refill   ALPRAZolam (XANAX PO) Take by mouth as needed.     escitalopram (LEXAPRO) 5 MG tablet Take 5 mg by mouth daily.     etonogestrel-ethinyl estradiol (NUVARING) 0.12-0.015 MG/24HR vaginal ring INSERT VAGINALLY &  LEAVE IN PLACE FOR 4 CONSECUTIVE WEEKS, THEN REMOVE AND REPLACE WITH A NEW RING. 1 each 0   valACYclovir (VALTREX) 500 MG tablet Take one tablet BID for 3 days as needed 30 tablet 1   No current facility-administered medications for this visit.    Family History  Problem Relation Age of Onset   Hypertension Father    Anxiety disorder Father     Review of Systems  Exam:   BP 122/80 (BP Location: Right Arm, Patient Position: Sitting, Cuff Size: Normal)   Pulse 86   Ht 5' (1.524 m)   Wt 122 lb (55.3 kg)   LMP 09/11/2021   BMI 23.83 kg/m   Weight change: '@WEIGHTCHANGE'$ @ Height:   Height: 5' (152.4 cm)  Ht Readings from Last 3 Encounters:  10/31/21 5' (1.524 m)  10/29/20 5' (1.524 m)  07/13/19 5' 0.75" (1.543 m)    General appearance: alert, cooperative and appears stated age Head: Normocephalic, without obvious abnormality, atraumatic Neck: no adenopathy, supple, symmetrical, trachea midline and thyroid normal to inspection and palpation Lungs: clear to auscultation bilaterally Cardiovascular: regular rate and rhythm Breasts: normal appearance, no masses or tenderness Abdomen: soft, non-tender; non distended,  no masses,  no organomegaly Extremities: extremities normal, atraumatic, no cyanosis or edema Skin: Skin color, texture, turgor normal. No rashes or lesions Lymph nodes: Cervical, supraclavicular, and axillary nodes normal.  No abnormal inguinal nodes palpated Neurologic: Grossly normal   Pelvic: External genitalia:  no lesions              Urethra:  normal appearing urethra with no masses, tenderness or lesions              Bartholins and Skenes: normal                 Vagina: normal appearing vagina with normal color and discharge, no lesions              Cervix: no lesions               Bimanual Exam:  Uterus:  normal size, contour, position, consistency, mobility, non-tender              Adnexa: no mass, fullness, tenderness               Rectovaginal: Confirms                Anus:  normal sphincter tone, no lesions  Wandra Scot, RMA chaperoned for the exam.  1. Well woman exam Discussed breast self exam Discussed calcium and vit D intake No pap this year  2. Encounter for surveillance of vaginal ring hormonal contraceptive device Discussed use - etonogestrel-ethinyl estradiol (NUVARING) 0.12-0.015 MG/24HR vaginal ring; INSERT VAGINALLY & LEAVE IN PLACE FOR 4 CONSECUTIVE WEEKS, THEN REMOVE AND REPLACE WITH A NEW RING.  Dispense: 3 each; Refill: 3 -Information on the paragard IUD given and reviewed  3. Elevated LDL cholesterol level - Lipid panel  4. Serum potassium elevated - Potassium

## 2021-10-30 ENCOUNTER — Other Ambulatory Visit: Payer: Self-pay

## 2021-10-30 DIAGNOSIS — Z3044 Encounter for surveillance of vaginal ring hormonal contraceptive device: Secondary | ICD-10-CM

## 2021-10-30 MED ORDER — ETONOGESTREL-ETHINYL ESTRADIOL 0.12-0.015 MG/24HR VA RING: VAGINAL_RING | VAGINAL | 0 refills | Status: DC

## 2021-10-30 NOTE — Telephone Encounter (Signed)
AEX 10/29/2020. Scheduled 10/31/2021.

## 2021-10-31 ENCOUNTER — Ambulatory Visit (INDEPENDENT_AMBULATORY_CARE_PROVIDER_SITE_OTHER): Payer: BLUE CROSS/BLUE SHIELD | Admitting: Obstetrics and Gynecology

## 2021-10-31 ENCOUNTER — Encounter: Payer: Self-pay | Admitting: Obstetrics and Gynecology

## 2021-10-31 VITALS — BP 122/80 | HR 86 | Ht 60.0 in | Wt 122.0 lb

## 2021-10-31 DIAGNOSIS — Z3044 Encounter for surveillance of vaginal ring hormonal contraceptive device: Secondary | ICD-10-CM

## 2021-10-31 DIAGNOSIS — Z01419 Encounter for gynecological examination (general) (routine) without abnormal findings: Secondary | ICD-10-CM | POA: Diagnosis not present

## 2021-10-31 DIAGNOSIS — E875 Hyperkalemia: Secondary | ICD-10-CM | POA: Diagnosis not present

## 2021-10-31 DIAGNOSIS — E7800 Pure hypercholesterolemia, unspecified: Secondary | ICD-10-CM

## 2021-10-31 MED ORDER — ETONOGESTREL-ETHINYL ESTRADIOL 0.12-0.015 MG/24HR VA RING: VAGINAL_RING | VAGINAL | 3 refills | Status: AC

## 2021-10-31 NOTE — Patient Instructions (Signed)

## 2021-11-01 LAB — LIPID PANEL
Cholesterol: 213 mg/dL — ABNORMAL HIGH (ref <200)
HDL: 76 mg/dL (ref >=50)
LDL Cholesterol (Calc): 119 mg/dL (calc) — ABNORMAL HIGH
Non-HDL Cholesterol (Calc): 137 mg/dL (calc) — ABNORMAL HIGH (ref <130)
Total CHOL/HDL Ratio: 2.8 (calc) (ref <5.0)
Triglycerides: 79 mg/dL (ref <150)

## 2021-11-01 LAB — POTASSIUM: Potassium: 4.3 mmol/L (ref 3.5–5.3)

## 2022-11-05 ENCOUNTER — Ambulatory Visit: Payer: BLUE CROSS/BLUE SHIELD | Admitting: Obstetrics and Gynecology

## 2022-12-22 ENCOUNTER — Ambulatory Visit: Payer: BLUE CROSS/BLUE SHIELD | Admitting: Obstetrics and Gynecology
